# Patient Record
Sex: Female | Born: 1970 | Race: White | Hispanic: No | Marital: Married | Smoking: Never smoker
Health system: Southern US, Community
[De-identification: ages and names within clinical notes are randomized; demographics above are authoritative.]

## PROBLEM LIST (undated history)

## (undated) DIAGNOSIS — D249 Benign neoplasm of unspecified breast: Secondary | ICD-10-CM

## (undated) HISTORY — PX: CLOSED REDUCTION NASAL FRACTURE: SUR256

## (undated) HISTORY — DX: Benign neoplasm of unspecified breast: D24.9

---

## 2019-03-30 LAB — HM MAMMOGRAPHY

## 2019-04-19 LAB — HM PAP SMEAR

## 2020-01-11 ENCOUNTER — Ambulatory Visit: Payer: Self-pay

## 2020-01-14 ENCOUNTER — Ambulatory Visit: Payer: Self-pay | Attending: Internal Medicine

## 2020-01-14 DIAGNOSIS — Z23 Encounter for immunization: Secondary | ICD-10-CM

## 2020-01-14 NOTE — Progress Notes (Signed)
   Covid-19 Vaccination Clinic  Name:  Chelsea Sellers    MRN: [id number] DOB: 11-24-1970  01/14/2020  [name] was observed post Covid-19 immunization for 15 minutes without incident. She was provided with Vaccine Information Sheet and instruction to access the V-Safe system.   [name] was instructed to call 911 with any severe reactions post vaccine: Marland Kitchen Difficulty breathing  . Swelling of face and throat  . A fast heartbeat  . A bad rash all over body  . Dizziness and weakness   Immunizations Administered    Name Date Dose VIS Date Route   Pfizer COVID-19 Vaccine 01/14/2020 10:07 AM 0.3 mL 09/28/2019 Intramuscular   Manufacturer: [place]   Lot: [id number]   [place]: [id number]

## 2020-02-05 ENCOUNTER — Ambulatory Visit: Payer: Self-pay | Attending: Internal Medicine

## 2020-02-05 DIAGNOSIS — Z23 Encounter for immunization: Secondary | ICD-10-CM

## 2020-02-05 NOTE — Progress Notes (Signed)
   Covid-19 Vaccination Clinic  Name:  Chelsea Sellers    MRN: [id number] DOB: 08/05/1971  02/05/2020  [name] was observed post Covid-19 immunization for 15 minutes without incident. She was provided with Vaccine Information Sheet and instruction to access the V-Safe system.   [name] was instructed to call 911 with any severe reactions post vaccine: Marland Kitchen Difficulty breathing  . Swelling of face and throat  . A fast heartbeat  . A bad rash all over body  . Dizziness and weakness   Immunizations Administered    Name Date Dose VIS Date Route   Pfizer COVID-19 Vaccine 02/05/2020 12:26 PM 0.3 mL 12/12/2018 Intramuscular   Manufacturer: [place]   Lot: [id number]   [place]: [id number]

## 2020-02-16 ENCOUNTER — Encounter (HOSPITAL_COMMUNITY): Payer: Self-pay

## 2020-02-16 ENCOUNTER — Ambulatory Visit (INDEPENDENT_AMBULATORY_CARE_PROVIDER_SITE_OTHER): Payer: 59

## 2020-02-16 ENCOUNTER — Ambulatory Visit (HOSPITAL_COMMUNITY)
Admission: EM | Admit: 2020-02-16 | Discharge: 2020-02-16 | Disposition: A | Discharge status: Home / Self Care | Payer: 59 | Attending: Emergency Medicine | Admitting: Emergency Medicine

## 2020-02-16 ENCOUNTER — Other Ambulatory Visit: Payer: Self-pay

## 2020-02-16 DIAGNOSIS — S022XXA Fracture of nasal bones, initial encounter for closed fracture: Secondary | ICD-10-CM

## 2020-02-16 MED ORDER — IBUPROFEN 800 MG PO TABS: 800.0000 mg | ORAL_TABLET | Freq: Three times a day (TID) | ORAL | 0 refills | Status: DC

## 2020-02-16 MED ORDER — MUPIROCIN 2 % EX OINT: 1.0000 "application " | TOPICAL_OINTMENT | Freq: Two times a day (BID) | CUTANEOUS | 0 refills | Status: DC

## 2020-02-16 NOTE — ED Triage Notes (Signed)
Patient reports fall on Thursday. Shoulder and facial pain.

## 2020-02-16 NOTE — Discharge Instructions (Signed)
May follow up with ENT for further evaluation of nasal fracture Use anti-inflammatories for pain/swelling. You may take up to 800 mg Ibuprofen every 8 hours with food. You may supplement Ibuprofen with Tylenol [phone] mg every 8 hours.  Ice Gentle range of motion of shoulder and knee Make sure all wounds healing- keep clean and dry- follow up for concerns for infection

## 2020-02-17 NOTE — ED Provider Notes (Signed)
[facility]    CSN: [id number] Arrival date & time: 02/16/20  1145      History   Chief Complaint Chief Complaint  Patient presents with  . Fall    HPI Chelsea Sellers is a 49 y.o. female no significant past medical history presenting today for evaluation of nose injury.  Patient notes that she was walking her dog on a trail Thursday, approximately 2 days ago.  She lost her balance while going downhill and ended up running into a tree.  She is unsure about specific details of how she hit the tree, but does not believe she fell to the ground.  Denies loss of consciousness.  She immediately had pain around her nose is along with bleeding from her nose and mouth.  She also reports some left shoulder soreness and left knee soreness.  She denies difficulty moving either of these joints, but does feel achy and slightly tight.  She denies use of blood thinners.  Denies headaches or vision changes.  Her main concern is her nose.  Reports that she has had a more prominent bump than normal.  She denies difficulty breathing.  She expresses concern over appearance.   HPI  History reviewed. No pertinent past medical history.  There are no problems to display for this patient.   History reviewed. No pertinent surgical history.  OB History   No obstetric history on file.      Home Medications    Prior to Admission medications   Medication Sig Start Date End Date Taking? Authorizing Provider  ibuprofen (ADVIL) 800 MG tablet Take 1 tablet (800 mg total) by mouth 3 (three) times daily. 02/16/20   Wilberth Damon C, PA-C  mupirocin ointment (BACTROBAN) 2 % Apply 1 application topically 2 (two) times daily. 02/16/20   Giovanni Bath, Elesa Hacker, PA-C    Family History No family history on file.  Social History Social History   Tobacco Use  . Smoking status: Never Smoker  . Smokeless tobacco: Never Used  Substance Use Topics  . Alcohol use: Yes    Alcohol/week: 1.0 - 2.0 standard drinks      Types: 1 - 2 Standard drinks or equivalent per week  . Drug use: Not on file     Allergies   Patient has no known allergies.   Review of Systems Review of Systems  Constitutional: Negative for activity change, chills, diaphoresis and fatigue.  HENT: Positive for facial swelling. Negative for ear pain, tinnitus and trouble swallowing.   Eyes: Negative for photophobia and visual disturbance.  Respiratory: Negative for cough, chest tightness and shortness of breath.   Cardiovascular: Negative for chest pain and leg swelling.  Gastrointestinal: Negative for abdominal pain, blood in stool, nausea and vomiting.  Musculoskeletal: Positive for arthralgias. Negative for back pain, gait problem, myalgias, neck pain and neck stiffness.  Skin: Positive for color change and wound.  Neurological: Negative for dizziness, weakness, light-headedness, numbness and headaches.     Physical Exam Triage Vital Signs ED Triage Vitals  Enc Vitals Group     BP 02/16/20 1222 (!) 146/83     Pulse Rate 02/16/20 1222 70     Resp 02/16/20 1222 16     Temp 02/16/20 1222 98.3 F (36.8 C)     Temp Source 02/16/20 1222 Oral     SpO2 02/16/20 1222 100 %     Weight --      Height --      Head Circumference --  Peak Flow --      Pain Score 02/16/20 1220 1     Pain Loc --      Pain Edu? --      Excl. in [place]? --    No data found.  Updated Vital Signs BP (!) 146/83 (BP Location: Right Arm)   Pulse 70   Temp 98.3 F (36.8 C) (Oral)   Resp 16   LMP 02/10/2020 (Exact Date)   SpO2 100%   Visual Acuity Right Eye Distance:   Left Eye Distance:   Bilateral Distance:    Right Eye Near:   Left Eye Near:    Bilateral Near:     Physical Exam Vitals and nursing note reviewed.  Constitutional:      Appearance: She is well-developed.     Comments: No acute distress  HENT:     Head: Normocephalic and atraumatic.     Ears:     Comments: No hemotympanum bilaterally    Nose: Nose normal.      Comments: Nose with visible bump, but appears midline, bruising noted over bridge of nose, bilateral nares patent, dried blood noted within right nares anteriorly, superior portions of nares appear patent and without any bleeding bilaterally    Mouth/Throat:     Comments: Abrasions with scabbing noted to upper and lower lips, bruising noted extending into inner upper and lower lip, no soft palate swelling, teeth intact Eyes:     Extraocular Movements: Extraocular movements intact.     Conjunctiva/sclera: Conjunctivae normal.     Pupils: Pupils are equal, round, and reactive to light.     Comments: No hyphema, anterior chamber clear bilaterally, no periorbital tenderness, right infraorbital area with mild ecchymosis, nontender to palpation  Cardiovascular:     Rate and Rhythm: Normal rate.  Pulmonary:     Effort: Pulmonary effort is normal. No respiratory distress.  Abdominal:     General: There is no distension.  Musculoskeletal:        General: Normal range of motion.     Cervical back: Neck supple.     Comments: Left shoulder: Full active range of motion of shoulder, nontender to palpation over clavicle, AC joint or scapular spine  Left knee: Nontender to palpation over patella or medial lateral joint line, tenderness to infrapatellar area, full active range of motion  Gait with minimal abnormality  Skin:    General: Skin is warm and dry.  Neurological:     Mental Status: She is alert and oriented to person, place, and time.      UC Treatments / Results  Labs (all labs ordered are listed, but only abnormal results are displayed) Labs Reviewed - No data to display  EKG   Radiology DG Nasal Bones  Result Date: 02/16/2020 CLINICAL DATA:  Nasal pain insert lying with bruising 2 days after fall EXAM: NASAL BONES - 3+ VIEW COMPARISON:  None FINDINGS: Osseous mineralization normal. Nasal septum midline. Displaced distal nasal bone fracture fragments. Anterior maxillary spine  intact. Paranasal sinuses clear. IMPRESSION: Displaced distal nasal bone fracture fragments. Electronically Signed   By: Lavonia Dana M.D.   On: 02/16/2020 14:29    Procedures Procedures (including critical care time)  Medications Ordered in UC Medications - No data to display  Initial Impression / Assessment and Plan / UC Course  I have reviewed the triage vital signs and the nursing notes.  Pertinent labs & imaging results that were available during my care of the patient were reviewed by  me and considered in my medical decision making (see chart for details).     Nose was displaced fracture fragment distally, will have follow-up with ENT as patient expresses concern over cosmetic appearances of fracture to see if this is anything worth fixing.  Recommended ice and anti-inflammatories.  Do not suspect fractures of shoulder or knee at this time.  Continue to monitor.  Discussed strict return precautions. Patient verbalized understanding and is agreeable with plan.  Final Clinical Impressions(s) / UC Diagnoses   Final diagnoses:  Closed fracture of nasal bone, initial encounter     Discharge Instructions     May follow up with ENT for further evaluation of nasal fracture Use anti-inflammatories for pain/swelling. You may take up to 800 mg Ibuprofen every 8 hours with food. You may supplement Ibuprofen with Tylenol [phone] mg every 8 hours.  Ice Gentle range of motion of shoulder and knee Make sure all wounds healing- keep clean and dry- follow up for concerns for infection   ED Prescriptions    Medication Sig Dispense Auth. Provider   ibuprofen (ADVIL) 800 MG tablet Take 1 tablet (800 mg total) by mouth 3 (three) times daily. 21 tablet Bhavya Grand C, PA-C   mupirocin ointment (BACTROBAN) 2 % Apply 1 application topically 2 (two) times daily. 30 g Truda Staub, [place] C, PA-C     PDMP not reviewed this encounter.   Janith Lima, [place] 02/17/20 [phone]

## 2020-02-22 ENCOUNTER — Ambulatory Visit (INDEPENDENT_AMBULATORY_CARE_PROVIDER_SITE_OTHER): Payer: 59 | Admitting: Otolaryngology

## 2020-02-22 ENCOUNTER — Other Ambulatory Visit: Payer: Self-pay

## 2020-02-22 ENCOUNTER — Encounter (INDEPENDENT_AMBULATORY_CARE_PROVIDER_SITE_OTHER): Payer: Self-pay | Admitting: Otolaryngology

## 2020-02-22 VITALS — Temp 98.1°F

## 2020-02-22 DIAGNOSIS — S022XXA Fracture of nasal bones, initial encounter for closed fracture: Secondary | ICD-10-CM

## 2020-02-22 NOTE — Progress Notes (Signed)
HPI: Chelsea Sellers is a 49 y.o. female who presents for evaluation of nasal fracture.  Patient was walking her dog on 02/14/2020 and fell landing on her nose.  She has slight abrasion to the nose.  She recently underwent plain nasal x-rays that demonstrated distal nasal bone fracture with the fracture piece pointing perpendicular.  She is having no trouble breathing but slight pressure on the lower lip and upper teeth.  No bleeding from her nose. She does not smoke. She is on no medications and NKDA  No past medical history on file. No past surgical history on file. Social History   Socioeconomic History  . Marital status: Married    Spouse name: Not on file  . Number of children: Not on file  . Years of education: Not on file  . Highest education level: Not on file  Occupational History  . Not on file  Tobacco Use  . Smoking status: Never Smoker  . Smokeless tobacco: Never Used  Substance and Sexual Activity  . Alcohol use: Yes    Alcohol/week: 1.0 - 2.0 standard drinks    Types: 1 - 2 Standard drinks or equivalent per week  . Drug use: Not on file  . Sexual activity: Not on file  Other Topics Concern  . Not on file  Social History Narrative  . Not on file   Social Determinants of Health   Financial Resource Strain:   . Difficulty of Paying Living Expenses:   Food Insecurity:   . Worried About Charity fundraiser in the Last Year:   . Arboriculturist in the Last Year:   Transportation Needs:   . Film/video editor (Medical):   Marland Kitchen Lack of Transportation (Non-Medical):   Physical Activity:   . Days of Exercise per Week:   . Minutes of Exercise per Session:   Stress:   . Feeling of Stress :   Social Connections:   . Frequency of Communication with Friends and Family:   . Frequency of Social Gatherings with Friends and Family:   . Attends Religious Services:   . Active Member of Clubs or Organizations:   . Attends Archivist Meetings:   Marland Kitchen Marital Status:     No family history on file. No Known Allergies Prior to Admission medications   Medication Sig Start Date End Date Taking? Authorizing Provider  ibuprofen (ADVIL) 800 MG tablet Take 1 tablet (800 mg total) by mouth 3 (three) times daily. 02/16/20  Yes Wieters, Hallie C, PA-C  mupirocin ointment (BACTROBAN) 2 % Apply 1 application topically 2 (two) times daily. 02/16/20  Yes Wieters, Hallie C, PA-C     Positive ROS: Otherwise negative  All other systems have been reviewed and were otherwise negative with the exception of those mentioned in the HPI and as above.  Physical Exam: Constitutional: Alert, well-appearing, no acute distress Ears: External ears without lesions or tenderness. Ear canals are clear bilaterally with intact, clear TMs.  Nasal: She has a discrete hump on the nasal dorsum at the distal end of the nasal bones.  Nasal bones are relatively straight otherwise.  Intranasal exam reveals minimal septal deformity with no evidence of septal hematoma and clear nasal passages bilaterally. Oral: Lips and gums without lesions. Tongue and palate mucosa without lesions. Posterior oropharynx clear. Neck: No palpable adenopathy or masses Respiratory: Breathing comfortably.  Lungs clear to auscultation Cardiac exam: Regular rate and rhythm without murmur. Skin: No facial/neck lesions or rash noted.  Procedure:  After discussion with the patient we elected to try manual manipulation in the office.  I injected a quarter of a cc of local anesthetic 1% Xylocaine with epinephrine on either side of the nasal bones.  Then using manual manipulation I attempted to reduce the distal fragmented segment of the nasal bone but this had a tendency to reduce and then pop back up.  No bleeding was caused.  Procedures  Assessment: Nasal fracture of distal nasal bone tip.  Plan: Discussed with patient that this will require open reduction in order to remove the fragmented tip. This can be accomplished  through intranasal excision but will require general anesthetic and reviewed this with her. She will call us back when she decides to schedule this.  Radene Journey, MD

## 2020-09-20 ENCOUNTER — Ambulatory Visit: Payer: Managed Care, Other (non HMO) | Attending: Internal Medicine

## 2020-09-20 DIAGNOSIS — Z23 Encounter for immunization: Secondary | ICD-10-CM

## 2020-09-20 NOTE — Progress Notes (Signed)
   Covid-19 Vaccination Clinic  Name:  [name]    MRN: [id number] DOB: [birth year]  09/20/2020  Chelsea Sellers was observed post Covid-19 immunization for 15 minutes without incident. She was provided with Vaccine Information Sheet and instruction to access the V-Safe system.   [name] was instructed to call 911 with any severe reactions post vaccine: Marland Kitchen Difficulty breathing  . Swelling of face and throat  . A fast heartbeat  . A bad rash all over body  . Dizziness and weakness   Immunizations Administered    Name Date Dose VIS Date Route   Pfizer COVID-19 Vaccine 09/20/2020  9:58 AM 0.3 mL 08/06/2020 Intramuscular   Manufacturer: [place]   Lot: [id number]   NDC: [address]-9

## 2021-01-06 ENCOUNTER — Telehealth: Payer: Self-pay

## 2021-01-06 ENCOUNTER — Other Ambulatory Visit: Payer: Self-pay | Admitting: Family Medicine

## 2021-01-06 ENCOUNTER — Other Ambulatory Visit: Payer: Self-pay

## 2021-01-06 ENCOUNTER — Encounter: Payer: Self-pay | Admitting: Family Medicine

## 2021-01-06 ENCOUNTER — Ambulatory Visit: Payer: Managed Care, Other (non HMO) | Admitting: Family Medicine

## 2021-01-06 VITALS — BP 120/78 | HR 67 | Ht 63.0 in | Wt 111.2 lb

## 2021-01-06 DIAGNOSIS — Z9889 Other specified postprocedural states: Secondary | ICD-10-CM

## 2021-01-06 DIAGNOSIS — Z833 Family history of diabetes mellitus: Secondary | ICD-10-CM | POA: Diagnosis not present

## 2021-01-06 DIAGNOSIS — D242 Benign neoplasm of left breast: Secondary | ICD-10-CM

## 2021-01-06 DIAGNOSIS — N644 Mastodynia: Secondary | ICD-10-CM

## 2021-01-06 DIAGNOSIS — Z872 Personal history of diseases of the skin and subcutaneous tissue: Secondary | ICD-10-CM

## 2021-01-06 NOTE — Progress Notes (Signed)
   Subjective:    Patient ID: Chelsea Sellers, female    DOB: 1971/03/29, 50 y.o.   MRN: [id number]  HPI Chief Complaint  Patient presents with  . new pt    New pt get established, wants hormones checked., breast lump- right breast-, some numbness at nose due to injury last year   She is new to the practice and here to establish care. Previous medical care: moved here from [place] in 2020   Other providers: None   Having regular cycles every 27-29 days. Bleeding is light. Has never missed a cycle.  Occasional hot flashes   History of 2 breast biopsies in left breast. Benign  No family history of breast cancer  Currently she is needing follow up on right breast. [place] in [place] in 2020 showed fibroadenoma and cyst.    Disc issues on L-5, S-1 and occasional numbness down left foot.  States she saw neurosurgery and ortho in [place]. PT exercises   States her mother has diabetes.    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    Reviewed allergies, medications, past medical, surgical, family, and social history.    Review of Systems Pertinent positives and negatives in the history of present illness.     Objective:   Physical Exam BP 120/78   Pulse 67   Ht 5\' 3"  (1.6 m)   Wt 111 lb 3.2 oz (50.4 kg)   BMI 19.70 kg/m   Alert and in no distress.  Cardiac exam shows a regular sinus rhythm without murmurs or gallops. Lungs are clear to auscultation.  Breast exam unremarkable, no discrete palpable mass.       Assessment & Plan:  Fibroadenoma of left breast - Plan: CBC with Differential/Platelet, Comprehensive metabolic panel, MM Digital Diagnostic Bilat  History of cyst of breast - Plan: MM Digital Diagnostic Bilat  Family history of diabetes mellitus in mother - Plan: CBC with Differential/Platelet, Comprehensive metabolic panel  Breast tenderness - Plan: CBC with Differential/Platelet, Comprehensive metabolic panel, MM Digital  Diagnostic Bilat  H/O benign breast biopsy - Plan: MM Digital Diagnostic Bilat  I will order a diagnostic mammogram.  Check labs today family history of diabetes and recent breast tenderness. Counseling on menopausal symptoms and how to tell if she is becoming perimenopausal.

## 2021-01-06 NOTE — Telephone Encounter (Signed)
Called pt. LM letting her know that [facility] at 8:40 a.m. arrive at 8:20 a.m.

## 2021-01-07 LAB — CBC WITH DIFFERENTIAL/PLATELET
Basophils Absolute: 0 10*3/uL (ref 0.0–0.2)
Basos: 1 %
EOS (ABSOLUTE): 0.1 10*3/uL (ref 0.0–0.4)
Eos: 1 %
Hematocrit: 38.5 % (ref 34.0–46.6)
Hemoglobin: 12.6 g/dL (ref 11.1–15.9)
Immature Grans (Abs): 0 10*3/uL (ref 0.0–0.1)
Immature Granulocytes: 0 %
Lymphocytes Absolute: 1.8 10*3/uL (ref 0.7–3.1)
Lymphs: 30 %
MCH: 28.7 pg (ref 26.6–33.0)
MCHC: 32.7 g/dL (ref 31.5–35.7)
MCV: 88 fL (ref 79–97)
Monocytes Absolute: 0.4 10*3/uL (ref 0.1–0.9)
Monocytes: 7 %
Neutrophils Absolute: 3.7 10*3/uL (ref 1.4–7.0)
Neutrophils: 61 %
Platelets: 320 10*3/uL (ref 150–450)
RBC: 4.39 x10E6/uL (ref 3.77–5.28)
RDW: 13.3 % (ref 11.7–15.4)
WBC: 6.1 10*3/uL (ref 3.4–10.8)

## 2021-01-07 LAB — COMPREHENSIVE METABOLIC PANEL
ALT: 9 IU/L (ref 0–32)
AST: 19 IU/L (ref 0–40)
Albumin/Globulin Ratio: 2 (ref 1.2–2.2)
Albumin: 4.9 g/dL — ABNORMAL HIGH (ref 3.8–4.8)
Alkaline Phosphatase: 43 IU/L — ABNORMAL LOW (ref 44–121)
BUN/Creatinine Ratio: 12 (ref 9–23)
BUN: 9 mg/dL (ref 6–24)
Bilirubin Total: 0.4 mg/dL (ref 0.0–1.2)
CO2: 21 mmol/L (ref 20–29)
Calcium: 9.5 mg/dL (ref 8.7–10.2)
Chloride: 102 mmol/L (ref 96–106)
Creatinine, Ser: 0.74 mg/dL (ref 0.57–1.00)
Globulin, Total: 2.4 g/dL (ref 1.5–4.5)
Glucose: 95 mg/dL (ref 65–99)
Potassium: 4 mmol/L (ref 3.5–5.2)
Sodium: 141 mmol/L (ref 134–144)
Total Protein: 7.3 g/dL (ref 6.0–8.5)
eGFR: 99 mL/min/{1.73_m2} (ref >59)

## 2021-01-07 NOTE — Progress Notes (Signed)
Her labs are fine

## 2021-01-13 ENCOUNTER — Telehealth: Payer: Self-pay | Admitting: Family Medicine

## 2021-01-13 NOTE — Telephone Encounter (Signed)
Requested records received from [facility]

## 2021-01-19 ENCOUNTER — Encounter: Payer: Self-pay | Admitting: Internal Medicine

## 2021-01-27 ENCOUNTER — Telehealth: Payer: Self-pay | Admitting: Family Medicine

## 2021-01-27 ENCOUNTER — Encounter: Payer: Self-pay | Admitting: Internal Medicine

## 2021-01-27 NOTE — Telephone Encounter (Signed)
Received requested info from [facility]

## 2021-02-13 ENCOUNTER — Telehealth: Payer: Self-pay | Admitting: Internal Medicine

## 2021-02-13 NOTE — Telephone Encounter (Signed)
Per the record from 2019, she will need regular follow up with annual mammograms.

## 2021-02-13 NOTE — Telephone Encounter (Signed)
Pt called and having issues with scheduling due to insurance but she is questioning if she is suppose to have a right [place] vs the left. She has already had biopsies of left breast and she was complaining of right breast issues when she was here back in march. Please advise.

## 2021-02-13 NOTE — Telephone Encounter (Signed)
Pt was notified.  

## 2021-02-17 ENCOUNTER — Other Ambulatory Visit: Payer: Managed Care, Other (non HMO)

## 2021-04-14 ENCOUNTER — Other Ambulatory Visit: Payer: Self-pay | Admitting: Family Medicine

## 2021-04-14 DIAGNOSIS — Z872 Personal history of diseases of the skin and subcutaneous tissue: Secondary | ICD-10-CM

## 2021-04-14 DIAGNOSIS — D242 Benign neoplasm of left breast: Secondary | ICD-10-CM

## 2021-04-14 DIAGNOSIS — Z9889 Other specified postprocedural states: Secondary | ICD-10-CM

## 2021-04-17 ENCOUNTER — Other Ambulatory Visit: Payer: Self-pay | Admitting: Family Medicine

## 2021-04-17 DIAGNOSIS — Z1231 Encounter for screening mammogram for malignant neoplasm of breast: Secondary | ICD-10-CM

## 2021-04-23 ENCOUNTER — Ambulatory Visit
Admission: RE | Admit: 2021-04-23 | Discharge: 2021-04-23 | Disposition: A | Discharge status: Home / Self Care | Payer: Managed Care, Other (non HMO) | Source: Ambulatory Visit | Attending: Family Medicine | Admitting: Family Medicine

## 2021-04-23 ENCOUNTER — Other Ambulatory Visit: Payer: Self-pay

## 2021-04-23 DIAGNOSIS — Z1231 Encounter for screening mammogram for malignant neoplasm of breast: Secondary | ICD-10-CM

## 2021-04-29 ENCOUNTER — Other Ambulatory Visit: Payer: Self-pay | Admitting: Family Medicine

## 2021-04-29 DIAGNOSIS — R928 Other abnormal and inconclusive findings on diagnostic imaging of breast: Secondary | ICD-10-CM

## 2021-05-18 ENCOUNTER — Other Ambulatory Visit: Payer: Self-pay | Admitting: Family Medicine

## 2021-05-18 ENCOUNTER — Ambulatory Visit
Admission: RE | Admit: 2021-05-18 | Discharge: 2021-05-18 | Disposition: A | Discharge status: Home / Self Care | Payer: Managed Care, Other (non HMO) | Source: Ambulatory Visit | Attending: Family Medicine | Admitting: Family Medicine

## 2021-05-18 ENCOUNTER — Other Ambulatory Visit: Payer: Self-pay

## 2021-05-18 DIAGNOSIS — D242 Benign neoplasm of left breast: Secondary | ICD-10-CM

## 2021-05-18 DIAGNOSIS — R928 Other abnormal and inconclusive findings on diagnostic imaging of breast: Secondary | ICD-10-CM

## 2021-05-18 DIAGNOSIS — Z872 Personal history of diseases of the skin and subcutaneous tissue: Secondary | ICD-10-CM

## 2021-05-18 DIAGNOSIS — N6002 Solitary cyst of left breast: Secondary | ICD-10-CM

## 2021-05-18 DIAGNOSIS — Z9889 Other specified postprocedural states: Secondary | ICD-10-CM

## 2021-11-19 ENCOUNTER — Other Ambulatory Visit: Payer: Managed Care, Other (non HMO)

## 2021-12-10 ENCOUNTER — Other Ambulatory Visit: Payer: Self-pay | Admitting: Family Medicine

## 2021-12-10 ENCOUNTER — Ambulatory Visit
Admission: RE | Admit: 2021-12-10 | Discharge: 2021-12-10 | Disposition: A | Discharge status: Home / Self Care | Payer: Managed Care, Other (non HMO) | Source: Ambulatory Visit | Attending: Family Medicine | Admitting: Family Medicine

## 2021-12-10 DIAGNOSIS — N6002 Solitary cyst of left breast: Secondary | ICD-10-CM

## 2022-01-04 LAB — HM PAP SMEAR: HPV, high-risk: NEGATIVE

## 2022-01-26 ENCOUNTER — Ambulatory Visit: Payer: Managed Care, Other (non HMO) | Admitting: Physician Assistant

## 2022-03-26 ENCOUNTER — Encounter: Payer: Self-pay | Admitting: Family Medicine

## 2022-03-26 ENCOUNTER — Ambulatory Visit: Payer: Managed Care, Other (non HMO) | Admitting: Family Medicine

## 2022-03-26 VITALS — BP 122/70 | HR 64 | Temp 97.9°F | Wt 111.0 lb

## 2022-03-26 DIAGNOSIS — M9260 Juvenile osteochondrosis of tarsus, unspecified ankle: Secondary | ICD-10-CM | POA: Diagnosis not present

## 2022-03-26 NOTE — Patient Instructions (Signed)
Use heel cups regularly take 2 Aleve twice per day for the next several weeks.  If it hurts do not do it

## 2022-03-26 NOTE — Progress Notes (Signed)
   Subjective:    Patient ID: Chelsea Sellers, female    DOB: 01-31-1971, 51 y.o.   MRN: [id number]  HPI She complains of a 1 month history of right heel pain.  No history of injury or overuse.  The pain is worse with standing.  She has been reading on this and has been doing some stretching exercises thinking this might be plantar fasciitis.  Apparently the pain is not worse when she first gets up in the morning and as mentioned earlier standing makes it worse.   Review of Systems     Objective:   Physical Exam Exam of the right foot shows full range of motion of the foot.  No tenderness over the Achilles tendon or in the rectal calcaneal area.  No tenderness over the calcaneal spur.  Slight tenderness along the posterior medial aspect of the calcaneus.       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  Recommend 2 Aleve twice per day for the next 2+ weeks and also heel cups.  Explained that there might be more that we can do if this continues.

## 2022-04-16 ENCOUNTER — Encounter: Payer: Self-pay | Admitting: Internal Medicine

## 2022-06-11 ENCOUNTER — Ambulatory Visit
Admission: RE | Admit: 2022-06-11 | Discharge: 2022-06-11 | Disposition: A | Discharge status: Home / Self Care | Payer: Managed Care, Other (non HMO) | Source: Ambulatory Visit | Attending: Family Medicine | Admitting: Family Medicine

## 2022-06-11 DIAGNOSIS — N6002 Solitary cyst of left breast: Secondary | ICD-10-CM

## 2022-06-23 ENCOUNTER — Encounter: Payer: Self-pay | Admitting: Internal Medicine

## 2022-07-27 ENCOUNTER — Encounter: Payer: Self-pay | Admitting: Internal Medicine

## 2022-08-31 ENCOUNTER — Encounter: Payer: Self-pay | Admitting: Internal Medicine

## 2022-10-01 ENCOUNTER — Ambulatory Visit (INDEPENDENT_AMBULATORY_CARE_PROVIDER_SITE_OTHER): Payer: Managed Care, Other (non HMO)

## 2022-10-01 ENCOUNTER — Encounter: Payer: Self-pay | Admitting: Podiatry

## 2022-10-01 ENCOUNTER — Ambulatory Visit: Payer: Managed Care, Other (non HMO) | Admitting: Podiatry

## 2022-10-01 DIAGNOSIS — M722 Plantar fascial fibromatosis: Secondary | ICD-10-CM

## 2022-10-01 DIAGNOSIS — R52 Pain, unspecified: Secondary | ICD-10-CM

## 2022-10-01 MED ORDER — DICLOFENAC SODIUM 75 MG PO TBEC: 75.0000 mg | DELAYED_RELEASE_TABLET | Freq: Two times a day (BID) | ORAL | 2 refills | Status: DC

## 2022-10-01 MED ORDER — TRIAMCINOLONE ACETONIDE 10 MG/ML IJ SUSP
10.0000 mg | Freq: Once | INTRAMUSCULAR | Status: AC
  Administered 2022-10-01: 10 mg

## 2022-10-01 NOTE — Patient Instructions (Signed)

## 2022-10-04 NOTE — Progress Notes (Signed)
Subjective:   Patient ID: Chelsea Sellers, female   DOB: 51 y.o.   MRN: [id number]   HPI Patient presents with a lot of pain in the plantar of the right heel states its really been sore making it hard to walk on and states it has been going on for several months.  Left 1 mildly sore not to the same degree and patient does not remember specific injury pattern.  Patient does not smoke likes to be active   Review of Systems  All other systems reviewed and are negative.       Objective:  Physical Exam Vitals and nursing note reviewed.  Constitutional:      Appearance: She is well-developed.  Pulmonary:     Effort: Pulmonary effort is normal.  Musculoskeletal:        General: Normal range of motion.  Skin:    General: Skin is warm.  Neurological:     Mental Status: She is alert.     Neurovascular status intact muscle strength adequate range of motion adequate patient found to have discomfort fluid buildup around the plantar aspect heel region right over left heel with fluid at the insertional point tendon calcaneus.  Patient is found to have good digital perfusion well-oriented x 3     Assessment:  Acute plantar fasciitis right inflammation fluid of the medial band at insertion     Plan:  H&P x-ray reviewed condition discussed sterile prep injected the fascia right 3 mg Kenalog 5 mg Xylocaine applied fascial brace properly fitted into the arch to take pressure off the arch and to lift it up and prescribed diclofenac 75 mg twice daily.  Reappoint to recheck  X-rays indicate spur formation no indication stress fracture arthritis

## 2022-10-15 ENCOUNTER — Ambulatory Visit: Payer: Managed Care, Other (non HMO) | Admitting: Podiatry

## 2022-11-15 ENCOUNTER — Other Ambulatory Visit: Payer: Self-pay | Admitting: Podiatry

## 2022-11-15 DIAGNOSIS — R52 Pain, unspecified: Secondary | ICD-10-CM

## 2022-11-15 DIAGNOSIS — M722 Plantar fascial fibromatosis: Secondary | ICD-10-CM

## 2022-12-09 ENCOUNTER — Ambulatory Visit: Payer: Managed Care, Other (non HMO) | Admitting: Nurse Practitioner

## 2022-12-09 ENCOUNTER — Encounter: Payer: Self-pay | Admitting: Nurse Practitioner

## 2022-12-09 VITALS — BP 116/70 | HR 74 | Wt 112.0 lb

## 2022-12-09 DIAGNOSIS — R198 Other specified symptoms and signs involving the digestive system and abdomen: Secondary | ICD-10-CM

## 2022-12-09 DIAGNOSIS — R300 Dysuria: Secondary | ICD-10-CM

## 2022-12-09 LAB — POCT URINALYSIS DIP (CLINITEK)
Bilirubin, UA: NEGATIVE
Blood, UA: NEGATIVE
Glucose, UA: NEGATIVE mg/dL
Ketones, POC UA: NEGATIVE mg/dL
Leukocytes, UA: NEGATIVE
Nitrite, UA: NEGATIVE
POC PROTEIN,UA: NEGATIVE
Spec Grav, UA: 1.01 (ref 1.010–1.025)
Urobilinogen, UA: 0.2 E.U./dL
pH, UA: 7.5 (ref 5.0–8.0)

## 2022-12-09 MED ORDER — PANTOPRAZOLE SODIUM 40 MG PO TBEC: DELAYED_RELEASE_TABLET | ORAL | 0 refills | Status: DC

## 2022-12-09 NOTE — Patient Instructions (Addendum)
We will try pantoprazole for about a month and see how you are doing.  If you are not feeling better by Monday, please let me know.   I will call you if your urine shows infection.

## 2022-12-09 NOTE — Progress Notes (Signed)
Orma Render, DNP, AGNP-c [facility] [address] [place], [place] [address] [phone]  Subjective:   Chelsea Sellers is a 52 y.o. female presents to day for evaluation of: Tahiri presents today with concerns about abdominal discomfort. She describes experiencing pain and a pinching sensation in the abdominal area, which she finds very annoying. The discomfort began within the past week or two, and she recalls an episode of severe gas pain approximately a month ago. Deah has identified a correlation between the consumption of certain foods and the onset of her symptoms, including a heavy sensation post-eating and increased discomfort while sleeping. She does not report any heartburn-like symptoms and characterizes the pain as centrally located, occasionally radiating slightly to the left side.  She also reports a pulling sensation originating from the belly button area, accompanied by increased gas. Dalasia denies experiencing any back or shoulder blade pain but does mention a history of a degenerative disc, which contributes to tightness and tenderness. She notes that poor posture and slouching seem to worsen the pinching sensation. Elta denies the presence of nausea, vomiting, or shortness of breath. Additionally, she reports a history of urinary frequency and urinary tract infections.  PMH, Medications, and Allergies reviewed and updated in chart as appropriate.   ROS negative except for what is listed in HPI. Objective:  BP 116/70   Pulse 74   Wt 112 lb (50.8 kg)   BMI 19.84 kg/m  Physical Exam Vitals and nursing note reviewed.  Constitutional:      Appearance: Normal appearance. She is normal weight.  HENT:     Head: Normocephalic.  Eyes:     Pupils: Pupils are equal, round, and reactive to light.  Neck:     Vascular: No carotid bruit.  Cardiovascular:     Rate and Rhythm: Normal rate and regular rhythm.     Pulses: Normal pulses.     Heart sounds: Normal  heart sounds.  Pulmonary:     Effort: Pulmonary effort is normal.     Breath sounds: Normal breath sounds.  Abdominal:     General: Abdomen is flat. Bowel sounds are normal. There is no distension.     Palpations: Abdomen is soft.     Tenderness: There is no abdominal tenderness. There is no right CVA tenderness, left CVA tenderness, guarding or rebound.     Comments: Abdominal aorta palpable given body habitus. Tightening of the abdominal wall present to the left of the umbilicus.   Musculoskeletal:        General: Normal range of motion.  Lymphadenopathy:     Cervical: No cervical adenopathy.  Skin:    General: Skin is warm and dry.     Capillary Refill: Capillary refill takes less than 2 seconds.  Neurological:     General: No focal deficit present.     Mental Status: She is alert and oriented to person, place, and time.  Psychiatric:        Mood and Affect: Mood normal.           Assessment & Plan:   Problem List Items Addressed This Visit     Peptic ulcer symptoms - Primary    Patient presents with central abdominal pain, pinching sensation, increased gas, and discomfort under the rib cage. No heartburn, nausea, or vomiting reported. Patient reports localized pain and pulling sensation around the umbilical area. Physical examination revealed tightness in the area but no concerning findings related to the umbilical artery.Given the symptoms and  location, consider possible peptic ulcer presence. She does endorse a diet high in spicy foods.  Plan: - Start Pantoprazole (PPI) for 1 to 2 weeks at a high dose, then reduce to a normal dose for the remaining 2 to 3 weeks, for a total of 4 weeks of treatment. - Instruct the patient to avoid spicy foods and limit coffee, tea, and soda intake. - Monitor for improvement in symptoms and follow up in a month via phone call. - If no improvement within a couple of days, the patient should contact the clinic for further evaluation.       Relevant Medications   pantoprazole (PROTONIX) 40 MG tablet   Dysuria    UA negative in the office today. No changes.       Relevant Orders   POCT URINALYSIS DIP (CLINITEK) (Completed)      Orma Render, DNP, AGNP-c 12/10/2022  8:34 PM    History, Medications, Surgery, SDOH, and Family History reviewed and updated as appropriate.

## 2022-12-10 DIAGNOSIS — R300 Dysuria: Secondary | ICD-10-CM | POA: Insufficient documentation

## 2022-12-10 DIAGNOSIS — R198 Other specified symptoms and signs involving the digestive system and abdomen: Secondary | ICD-10-CM | POA: Insufficient documentation

## 2022-12-10 DIAGNOSIS — K219 Gastro-esophageal reflux disease without esophagitis: Secondary | ICD-10-CM | POA: Insufficient documentation

## 2022-12-10 NOTE — Assessment & Plan Note (Signed)
UA negative in the office today. No changes.

## 2022-12-10 NOTE — Assessment & Plan Note (Addendum)
Patient presents with central abdominal pain, pinching sensation, increased gas, and discomfort under the rib cage. No heartburn, nausea, or vomiting reported. Patient reports localized pain and pulling sensation around the umbilical area. Physical examination revealed tightness in the area but no concerning findings related to the umbilical artery.Given the symptoms and location, consider possible peptic ulcer presence. She does endorse a diet high in spicy foods.  Plan: - Start Pantoprazole (PPI) for 1 to 2 weeks at a high dose, then reduce to a normal dose for the remaining 2 to 3 weeks, for a total of 4 weeks of treatment. - Instruct the patient to avoid spicy foods and limit coffee, tea, and soda intake. - Monitor for improvement in symptoms and follow up in a month via phone call. - If no improvement within a couple of days, the patient should contact the clinic for further evaluation.

## 2023-04-28 IMAGING — MG MM DIGITAL SCREENING BILAT W/ TOMO AND CAD
8 series · 9 of 24 positions shown · non-contrast
Comparison: Previous exam(s).

CLINICAL DATA: Screening.

EXAM:
DIGITAL SCREENING BILATERAL MAMMOGRAM WITH TOMOSYNTHESIS AND CAD
TECHNIQUE: Bilateral screening digital craniocaudal and mediolateral oblique
mammograms were obtained. Bilateral screening digital breast
tomosynthesis was performed. The images were evaluated with
computer-aided detection.

[L CC synth-2D]
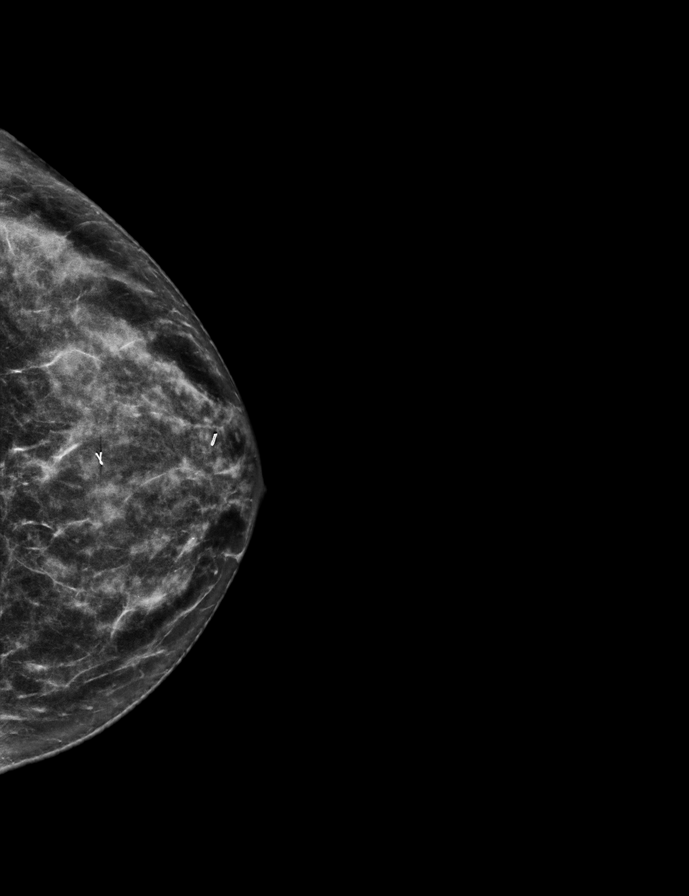

[R CC synth-2D]
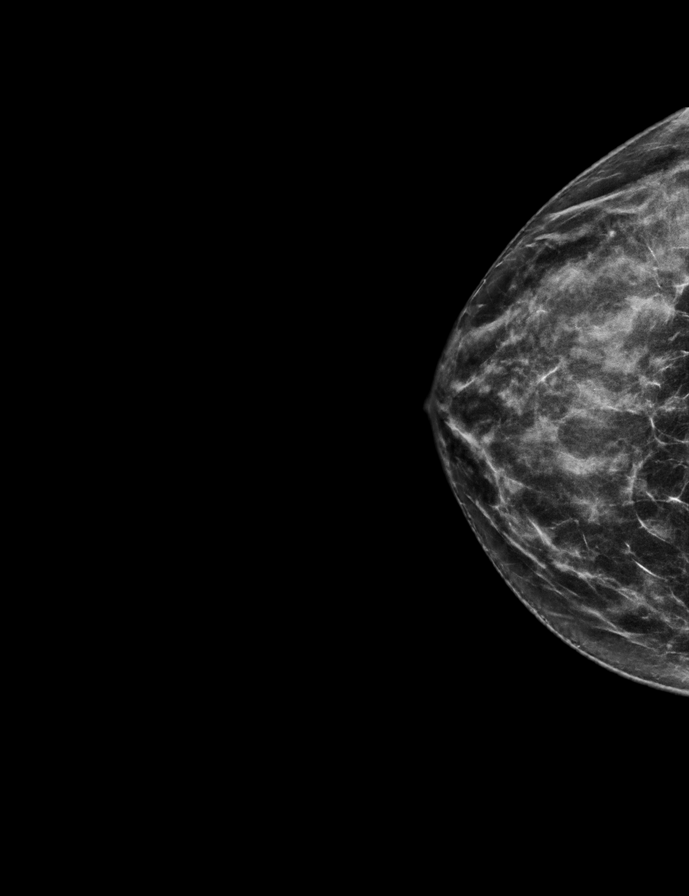

[R MLO synth-2D]
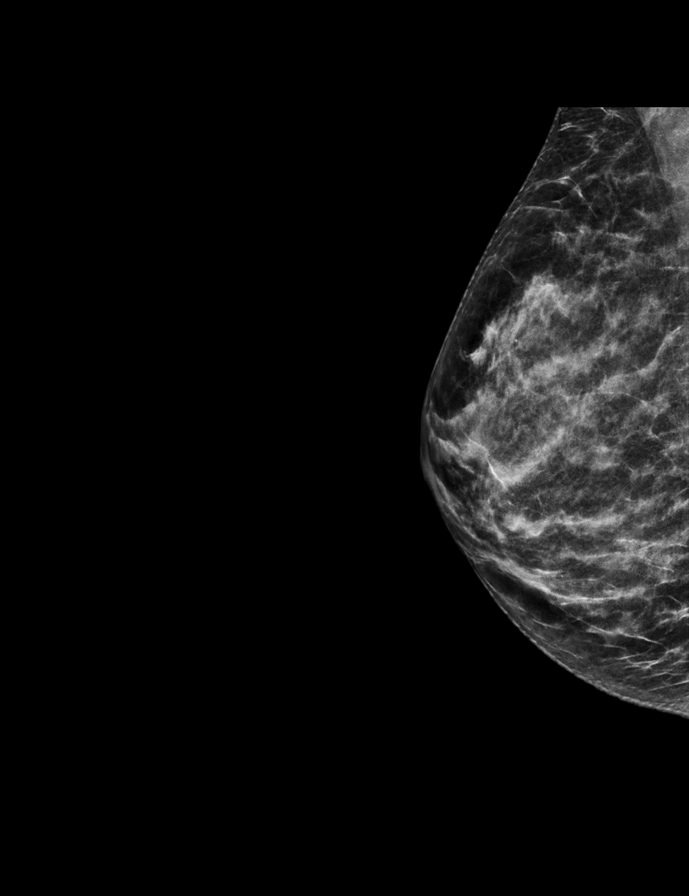

[L MLO synth-2D]
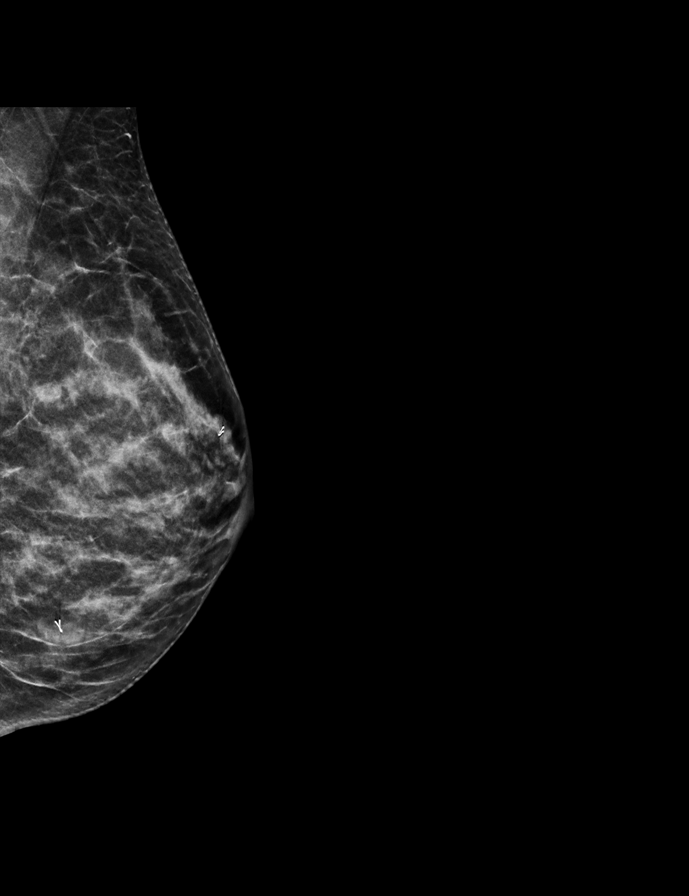

[L CC tomo · 2 of 54 frames shown]
[frame 18/54]
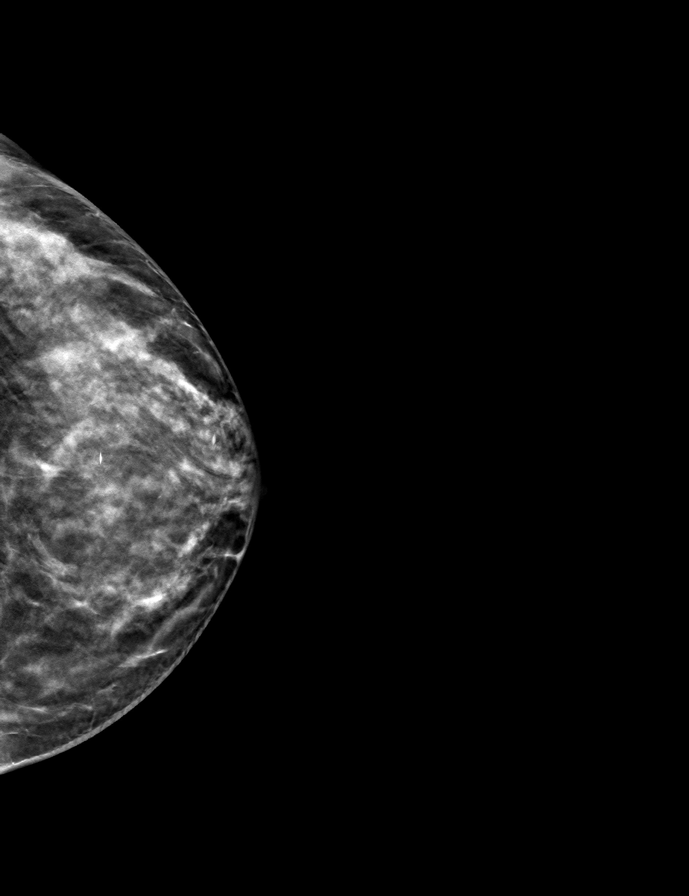
[frame 27/54]
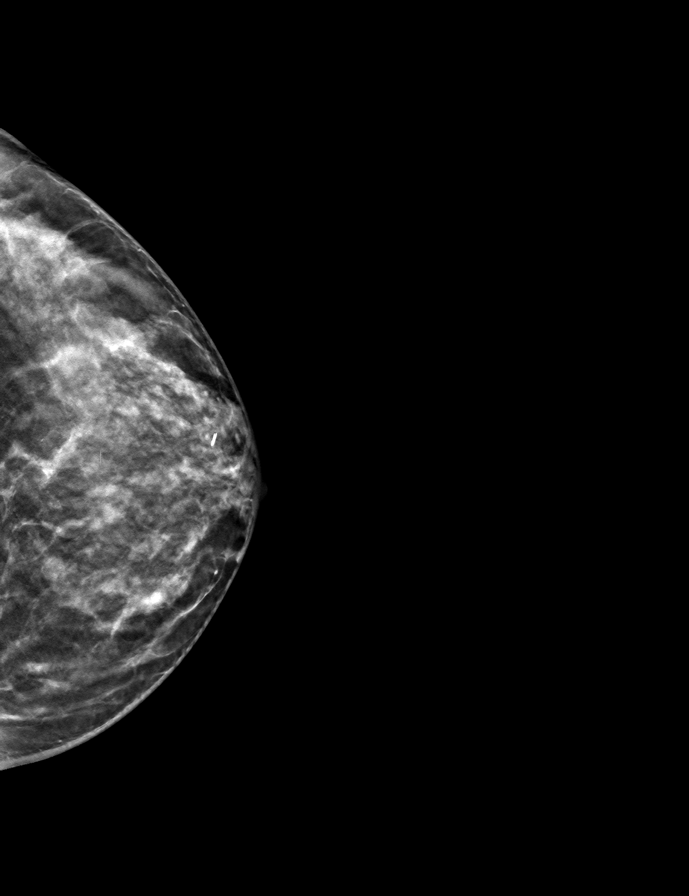

[R CC tomo · tomo slice 28/55.0]
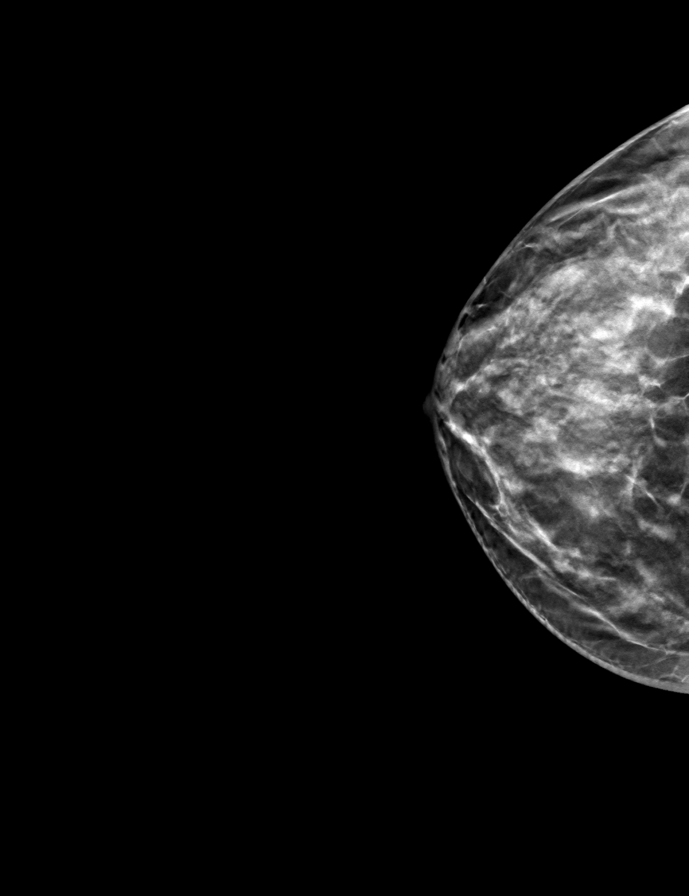

[L MLO tomo · tomo slice 25/50.0]
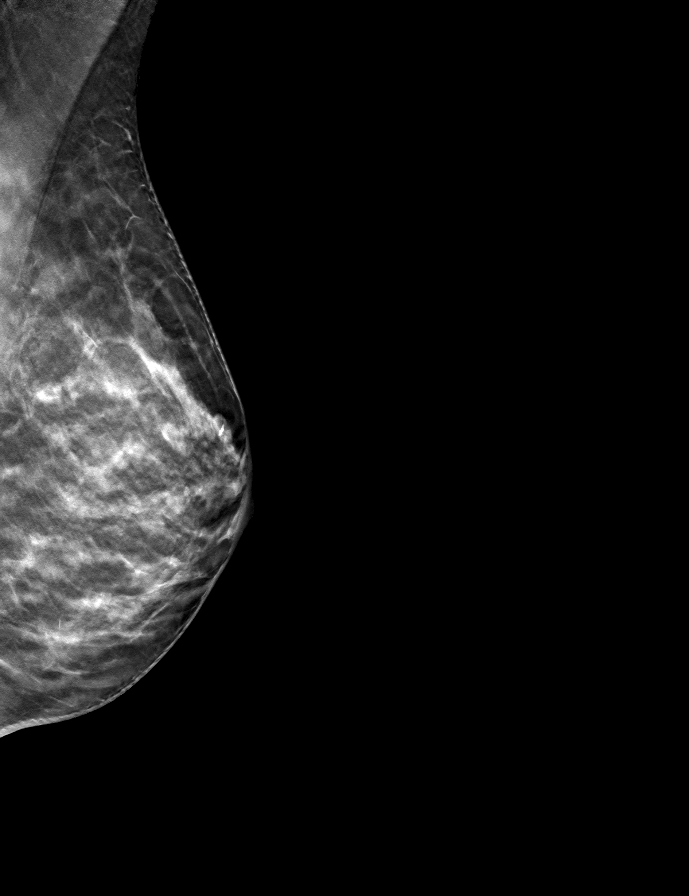

[R MLO tomo · tomo slice 25/49.0]
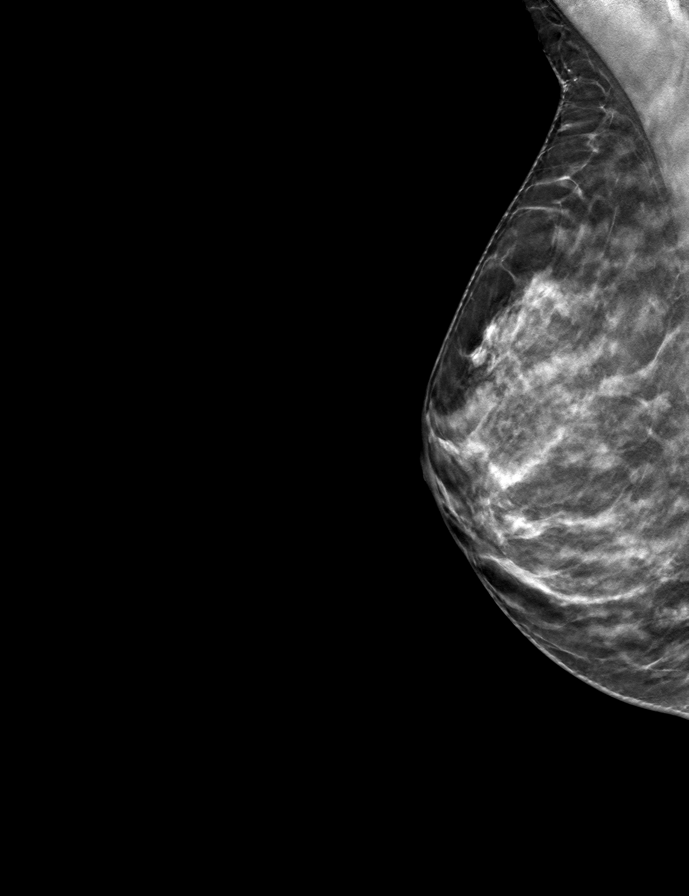

[9 of 24 positions shown; findings below may reference images not displayed]

ACR Breast Density Category c: The breast tissue is heterogeneously
dense, which may obscure small masses.
FINDINGS: In the left breast, a possible mass warrants further evaluation.
This possible mass is seen within the upper inner quadrant of the
LEFT breast, MLO slice 18 and cc slice 21.

In the right breast, no findings suspicious for malignancy.
IMPRESSION: Further evaluation is suggested for possible mass in the left
breast.

RECOMMENDATION:
Ultrasound of the left breast. (Code:[id number]-[id number])

The patient will be contacted regarding the findings, and additional
imaging will be scheduled.

BI-RADS CATEGORY  0: Incomplete. Need additional imaging evaluation
and/or prior mammograms for comparison.

## 2023-06-10 ENCOUNTER — Other Ambulatory Visit: Payer: Self-pay | Admitting: Nurse Practitioner

## 2023-06-10 DIAGNOSIS — N63 Unspecified lump in unspecified breast: Secondary | ICD-10-CM

## 2023-07-06 ENCOUNTER — Ambulatory Visit
Admission: RE | Admit: 2023-07-06 | Discharge: 2023-07-06 | Disposition: A | Discharge status: Home / Self Care | Payer: Managed Care, Other (non HMO) | Source: Ambulatory Visit | Attending: Nurse Practitioner | Admitting: Nurse Practitioner

## 2023-07-06 ENCOUNTER — Ambulatory Visit
Admission: RE | Admit: 2023-07-06 | Discharge: 2023-07-06 | Disposition: A | Discharge status: Home / Self Care | Payer: Managed Care, Other (non HMO) | Source: Ambulatory Visit | Attending: Nurse Practitioner

## 2023-07-06 DIAGNOSIS — N63 Unspecified lump in unspecified breast: Secondary | ICD-10-CM

## 2023-12-15 IMAGING — US US BREAST*L* LIMITED INC AXILLA
1 series · 5 of 5 positions shown · non-contrast
Comparison: Previous exams including LEFT breast ultrasound dated
05/18/2021.

CLINICAL DATA: Follow-up for probably benign complicated cyst in
the LEFT breast.

EXAM:
ULTRASOUND OF THE LEFT BREAST

[Series 1: us breast*left* limited inc axilla · 0.06mm/px · 5 of 5 slices shown]
[im 1/5]
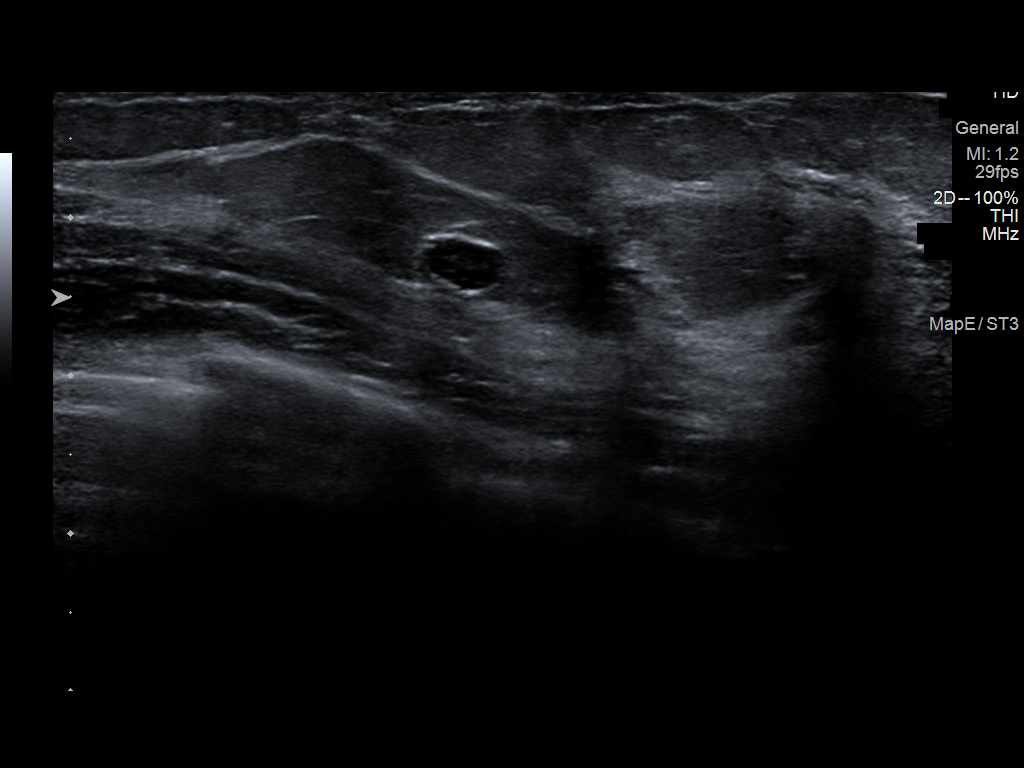
[im 2/5]
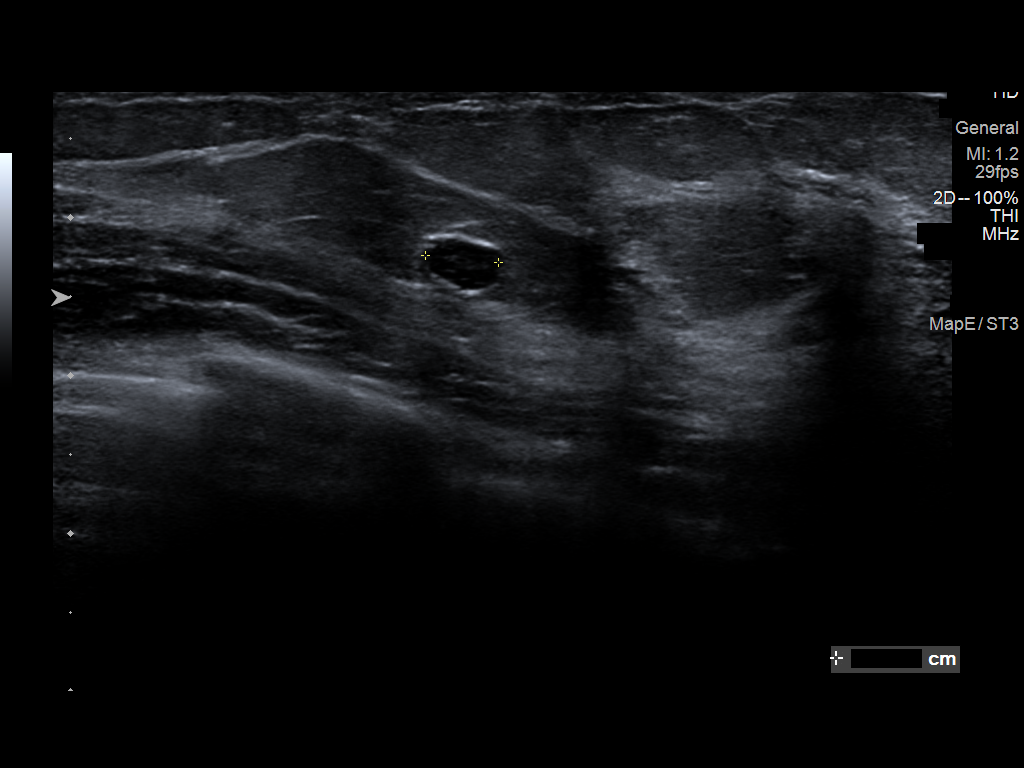
[im 3/5]
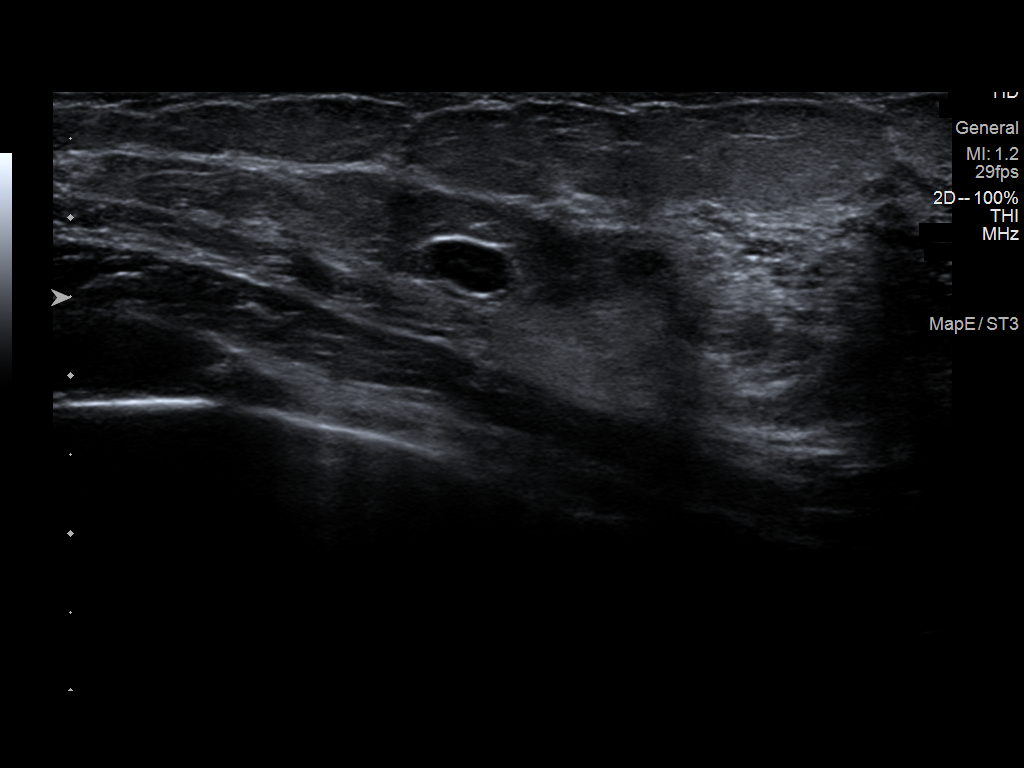
[im 4/5]
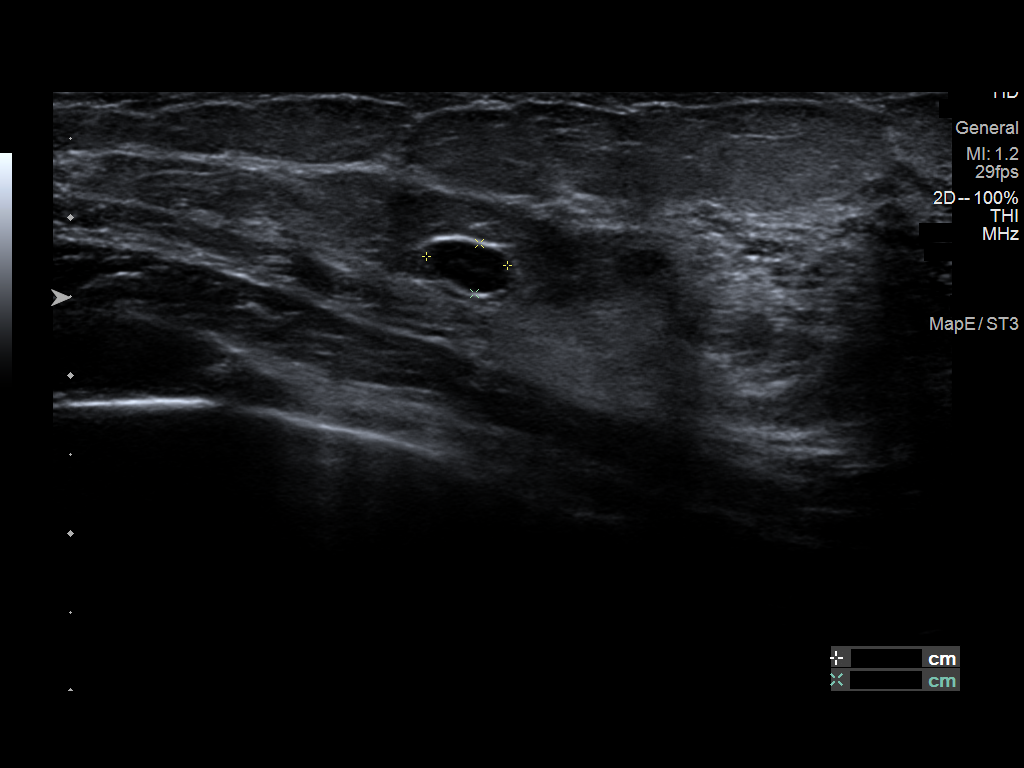
[im 5/5]
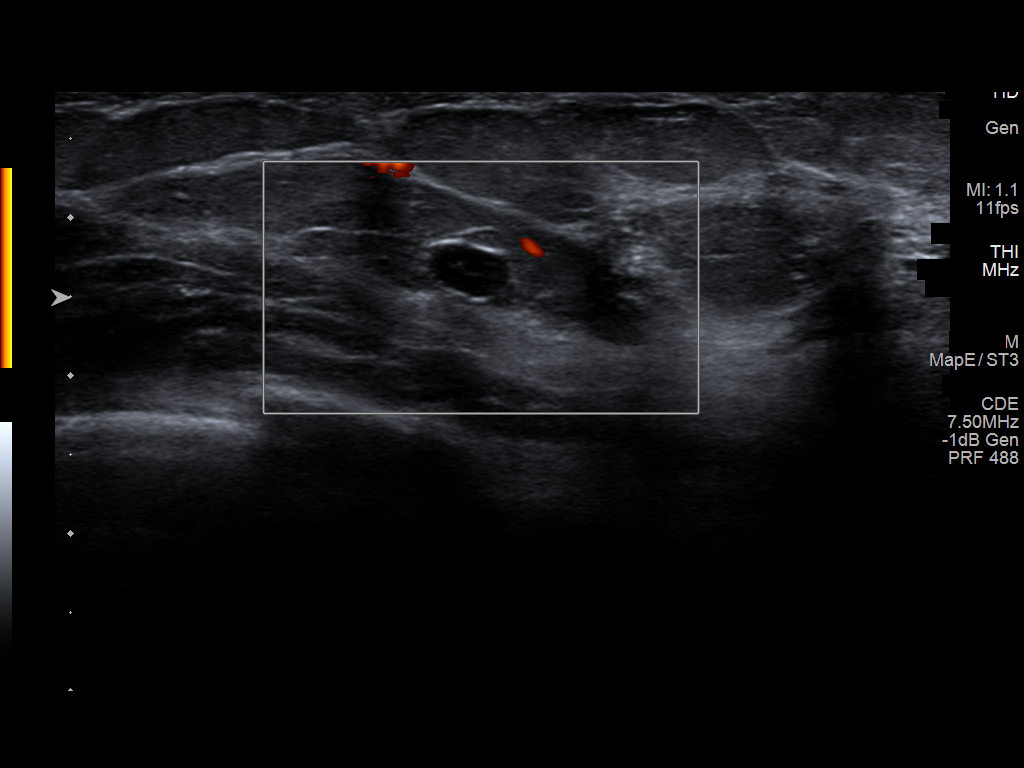

[5 of 5 positions shown; findings below may reference images not displayed]

FINDINGS: Targeted ultrasound is performed, again showing an oval
circumscribed nearly anechoic mass in the LEFT breast at the 10
o'clock axis, 3 cm from the nipple, measuring 5 x 3 x 5 mm, not
significantly changed compared to the previous ultrasound, again
most suggestive of a minimally complicated cyst.
IMPRESSION: Probably benign minimally complicated cyst within the LEFT breast at
the 10 o'clock axis, 3 cm from the nipple, measuring 5 x 3 x 5 mm,
not significantly changed compared to the previous ultrasound.
Recommend additional follow-up diagnostic exam in 6 months to ensure
continued stability.

RECOMMENDATION:
Bilateral diagnostic mammogram, and possible LEFT breast ultrasound,
in 6 months.

I have discussed the findings and recommendations with the patient.
If applicable, a reminder letter will be sent to the patient
regarding the next appointment.

BI-RADS CATEGORY  3: Probably benign.

## 2024-05-08 NOTE — Progress Notes (Unsigned)
 No chief complaint on file.  PCP is SaraBeth Early. Last seen in this office by PCP in 12/2022, also for GI complaints,  She had complained of central abdominal pain, pinching sensation, increased gas, and discomfort under the rib cage. No heartburn, nausea, or vomiting reported. Patient reports localized pain and pulling sensation around the umbilical area. Physical examination revealed tightness in the area but no concerning findings related to the umbilical artery.Given the symptoms and location, consider possible peptic ulcer presence. She does endorse a diet high in spicy foods.   She was treated with pantoprazole  40 mg (told to take BID x 1-2 weeks, then once daily, to complete 4 weeks of treatment). She was advised to avoid spicy foods, limit caffeine, and to f/u if not improving.    PMH, PSH, SH reviewed   ROS:    PHYSICAL EXAM:  There were no vitals taken for this visit.  Wt Readings from Last 3 Encounters:  12/09/22 112 lb (50.8 kg)  03/26/22 111 lb (50.3 kg)  01/06/21 111 lb 3.2 oz (50.4 kg)       ASSESSMENT/PLAN:  Has she ever had colon cancer screening??  No CPe's in chart  Needs to schedule CPE with PCP, SB

## 2024-05-09 ENCOUNTER — Encounter: Payer: Self-pay | Admitting: Family Medicine

## 2024-05-09 ENCOUNTER — Other Ambulatory Visit: Payer: Self-pay

## 2024-05-09 ENCOUNTER — Telehealth: Payer: Self-pay

## 2024-05-09 ENCOUNTER — Ambulatory Visit: Admitting: Family Medicine

## 2024-05-09 VITALS — BP 120/60 | HR 72 | Ht 63.0 in | Wt 109.4 lb

## 2024-05-09 DIAGNOSIS — Z681 Body mass index (BMI) 19 or less, adult: Secondary | ICD-10-CM | POA: Diagnosis not present

## 2024-05-09 DIAGNOSIS — Z1211 Encounter for screening for malignant neoplasm of colon: Secondary | ICD-10-CM | POA: Diagnosis not present

## 2024-05-09 DIAGNOSIS — K219 Gastro-esophageal reflux disease without esophagitis: Secondary | ICD-10-CM | POA: Diagnosis not present

## 2024-05-09 DIAGNOSIS — Z7185 Encounter for immunization safety counseling: Secondary | ICD-10-CM

## 2024-05-09 DIAGNOSIS — Z0000 Encounter for general adult medical examination without abnormal findings: Secondary | ICD-10-CM

## 2024-05-09 DIAGNOSIS — E559 Vitamin D deficiency, unspecified: Secondary | ICD-10-CM

## 2024-05-09 NOTE — Telephone Encounter (Signed)
 Pt has evening appt scheduled, wants to come on a different day for labs. Can you put labs in?

## 2024-05-09 NOTE — Patient Instructions (Addendum)
 Continue to limit acidic foods, caffeine from your diet, eating small meals more frequently rather than large meals and wait at least 2-3 hours before reclining. Try elevating the head of the bead with the brick/block, or a wedge pillow (raising the chest above the stomach, not just the head).  You can do a 2 week course of Prilosec OTC now, if symptoms are worsening. We discussed using prilosec OTC or Pepcid AC prior to a spicy Mexican meal, in order to prevent symptoms (vs Pepcid complete, or use of an antacid such as mylanta, maalox or tums as needed).  Red flags which would require seeing a gastroenterologist (and possible endoscopy) would be trouble swallowing, unexplained weight loss, or black or bloody stools.  You should be continuing to take the over-the-counter vitamin D3 daily, long-term. Without knowing your level, I can't give more specific recommendations. Keeping your vitamin d levels normal will help your bones--as long as you also get adequate calcium intake, and regular weight-bearing exercise. Try and build up some muscle strength.

## 2024-08-24 ENCOUNTER — Encounter: Payer: Self-pay | Admitting: Internal Medicine

## 2024-08-24 ENCOUNTER — Other Ambulatory Visit: Payer: Self-pay

## 2024-08-24 DIAGNOSIS — Z0000 Encounter for general adult medical examination without abnormal findings: Secondary | ICD-10-CM

## 2024-08-24 LAB — LIPID PANEL

## 2024-08-25 LAB — CMP14+EGFR
ALT: 7 IU/L (ref 0–32)
AST: 16 IU/L (ref 0–40)
Albumin: 4.3 g/dL (ref 3.8–4.9)
Alkaline Phosphatase: 44 IU/L — ABNORMAL LOW (ref 49–135)
BUN/Creatinine Ratio: 22 (ref 9–23)
BUN: 16 mg/dL (ref 6–24)
Bilirubin Total: 0.4 mg/dL (ref 0.0–1.2)
CO2: 22 mmol/L (ref 20–29)
Calcium: 9.8 mg/dL (ref 8.7–10.2)
Chloride: 99 mmol/L (ref 96–106)
Creatinine, Ser: 0.73 mg/dL (ref 0.57–1.00)
Globulin, Total: 2.7 g/dL (ref 1.5–4.5)
Glucose: 87 mg/dL (ref 70–99)
Potassium: 4.3 mmol/L (ref 3.5–5.2)
Sodium: 138 mmol/L (ref 134–144)
Total Protein: 7 g/dL (ref 6.0–8.5)
eGFR: 98 mL/min/1.73 (ref >59)

## 2024-08-25 LAB — CBC WITH DIFFERENTIAL/PLATELET
Basophils Absolute: 0 x10E3/uL (ref 0.0–0.2)
Basos: 1 %
EOS (ABSOLUTE): 0.1 x10E3/uL (ref 0.0–0.4)
Eos: 2 %
Hematocrit: 42.2 % (ref 34.0–46.6)
Hemoglobin: 13.8 g/dL (ref 11.1–15.9)
Immature Grans (Abs): 0 x10E3/uL (ref 0.0–0.1)
Immature Granulocytes: 0 %
Lymphocytes Absolute: 1.6 x10E3/uL (ref 0.7–3.1)
Lymphs: 31 %
MCH: 30.7 pg (ref 26.6–33.0)
MCHC: 32.7 g/dL (ref 31.5–35.7)
MCV: 94 fL (ref 79–97)
Monocytes Absolute: 0.3 x10E3/uL (ref 0.1–0.9)
Monocytes: 7 %
Neutrophils Absolute: 3.1 x10E3/uL (ref 1.4–7.0)
Neutrophils: 59 %
Platelets: 284 x10E3/uL (ref 150–450)
RBC: 4.5 x10E6/uL (ref 3.77–5.28)
RDW: 12.6 % (ref 11.7–15.4)
WBC: 5.2 x10E3/uL (ref 3.4–10.8)

## 2024-08-25 LAB — LIPID PANEL
Chol/HDL Ratio: 2.4 ratio (ref 0.0–4.4)
Cholesterol, Total: 215 mg/dL — ABNORMAL HIGH (ref 100–199)
HDL: 90 mg/dL (ref >39)
LDL Chol Calc (NIH): 116 mg/dL — ABNORMAL HIGH (ref 0–99)
Triglycerides: 48 mg/dL (ref 0–149)
VLDL Cholesterol Cal: 9 mg/dL (ref 5–40)

## 2024-08-25 LAB — HEMOGLOBIN A1C
Est. average glucose Bld gHb Est-mCnc: 108 mg/dL
Hgb A1c MFr Bld: 5.4 % (ref 4.8–5.6)

## 2024-08-25 LAB — TSH: TSH: 2.06 u[IU]/mL (ref 0.450–4.500)

## 2024-08-27 NOTE — Progress Notes (Signed)
 Flu: 08/15/2024 Covid: no Pneumonia: no T-dap: yes about 4 years ago Shingrix: no   Catheline Doing, DNP, AGNP-c [facility] [address] [place], [place] [address] Main Office [phone] VISIT TYPE: CPE on 08/28/2024 Today's Vitals   08/28/24 1537  BP: 108/70  Pulse: 68  Weight: 109 lb (49.4 kg)  Height: 5' 2.5 (1.588 m)   Body mass index is 19.62 kg/m. BP 108/70   Pulse 68   Ht 5' 2.5 (1.588 m)   Wt 109 lb (49.4 kg)   BMI 19.62 kg/m   Subjective:    Patient ID: Chelsea Sellers, female    DOB: September 05, 1971, 53 y.o.   MRN: [id number]  HPI: Periods : July 10, August 26, October 6  History of Present Illness SHANORA CHRISTENSEN is a 53 year old female who presents for annual exam.   Her acid reflux symptoms have worsened since her last visit in July. Initially, she attempted to manage the symptoms without medication but eventually started a two-week course of Prilosec, completed in mid-October. Since then, she has been taking Pepcid twice daily. Despite these efforts, she continues to experience significant symptoms, including a persistent burning sensation that sometimes reaches her mouth, described as a 'bitter' taste similar to chewing aspirin. The symptoms occur even when she is upright.  Her dietary habits have been challenging to maintain, especially with recent holidays like Halloween, and she admits to not being perfect in avoiding trigger foods. She recalls that when she first discussed her symptoms in 2024, the discomfort was more localized, but now it feels more widespread.  She was surprised to learn her LDL cholesterol was elevated on recent labs, given her diet is low in saturated fats. She consumes oatmeal, berries, flaxseed, and salads regularly, with occasional indulgences like bacon or sausage. She has a family history of high cholesterol, as her mother has had it for a long time.  She is experiencing menopausal symptoms, including hot  flashes and disrupted sleep, which she manages by dressing in layers. She has irregular menstrual cycles, with the last one occurring in October. She reports brain fog and occasional forgetfulness.  She has a history of normal Pap smears. She was advised to repeat the Pap smear this year, as she is not postmenopausal and continues to have menstrual cycles. She is cautious about undergoing invasive procedures unless necessary.  Pertinent items are noted in HPI.  Most Recent Depression Screen:     08/28/2024    3:37 PM 01/06/2021   11:18 AM  Depression screen PHQ 2/9  Decreased Interest 0 0  Down, Depressed, Hopeless 0 0  PHQ - 2 Score 0 0  Altered sleeping 0   Tired, decreased energy 0   Change in appetite 0   Feeling bad or failure about yourself  0   Trouble concentrating 0   Moving slowly or fidgety/restless 0   Suicidal thoughts 0   PHQ-9 Score 0   Difficult doing work/chores Not difficult at all    Most Recent Anxiety Screen:      No data to display         Most Recent Fall Screen:    08/28/2024    3:36 PM 01/06/2021   11:18 AM  Fall Risk   Falls in the past year? 0 1  Number falls in past yr: 0 0  Injury with Fall? 0 1  Risk for fall due to : No Fall Risks   Follow up Falls evaluation completed  Past medical history, surgical history, medications, allergies, family history and social history reviewed with patient today and changes made to appropriate areas of the chart.  Past Medical History:  Past Medical History:  Diagnosis Date   Breast fibroadenoma in female    Medications:  Current Outpatient Medications on File Prior to Visit  Medication Sig   famotidine-calcium carbonate-magnesium hydroxide (PEPCID COMPLETE) [phone] MG chewable tablet Chew 1 tablet by mouth daily as needed.   NONFORMULARY OR COMPOUNDED ITEM  (Patient not taking: Reported on 08/28/2024)   No current facility-administered medications on file prior to visit.   Surgical History:   Past Surgical History:  Procedure Laterality Date   CLOSED REDUCTION NASAL FRACTURE     Allergies:  No Known Allergies Family History:  Family History  Problem Relation Age of Onset   Diabetes Mother    Hyperlipidemia Mother    Prostate cancer Father    Prostate cancer Maternal Uncle    Colon cancer Maternal Grandmother        Objective:    BP 108/70   Pulse 68   Ht 5' 2.5 (1.588 m)   Wt 109 lb (49.4 kg)   BMI 19.62 kg/m   Wt Readings from Last 3 Encounters:  08/28/24 109 lb (49.4 kg)  05/09/24 109 lb 6.4 oz (49.6 kg)  12/09/22 112 lb (50.8 kg)    Physical Exam Vitals and nursing note reviewed.  Constitutional:      General: She is not in acute distress.    Appearance: Normal appearance.  HENT:     Head: Normocephalic and atraumatic.     Right Ear: Hearing, tympanic membrane, ear canal and external ear normal.     Left Ear: Hearing, tympanic membrane, ear canal and external ear normal.     Nose: Nose normal.     Right Sinus: No maxillary sinus tenderness or frontal sinus tenderness.     Left Sinus: No maxillary sinus tenderness or frontal sinus tenderness.     Mouth/Throat:     Lips: Pink.     Mouth: Mucous membranes are moist.     Pharynx: Oropharynx is clear.  Eyes:     General: Lids are normal. Vision grossly intact.     Extraocular Movements: Extraocular movements intact.     Conjunctiva/sclera: Conjunctivae normal.     Pupils: Pupils are equal, round, and reactive to light.     Funduscopic exam:    Right eye: Red reflex present.        Left eye: Red reflex present.    Visual Fields: Right eye visual fields normal and left eye visual fields normal.  Neck:     Thyroid: No thyromegaly.     Vascular: No carotid bruit.  Cardiovascular:     Rate and Rhythm: Normal rate and regular rhythm.     Chest Wall: PMI is not displaced.     Pulses: Normal pulses.          Dorsalis pedis pulses are 2+ on the right side and 2+ on the left side.       Posterior  tibial pulses are 2+ on the right side and 2+ on the left side.     Heart sounds: Normal heart sounds. No murmur heard. Pulmonary:     Effort: Pulmonary effort is normal. No respiratory distress.     Breath sounds: Normal breath sounds.  Abdominal:     General: Abdomen is flat. Bowel sounds are normal. There is no distension.  Palpations: Abdomen is soft. There is no hepatomegaly, splenomegaly or mass.     Tenderness: There is abdominal tenderness. There is no right CVA tenderness, left CVA tenderness, guarding or rebound.     Comments: Mild epigastric tenderness.  Musculoskeletal:        General: Normal range of motion.     Cervical back: Full passive range of motion without pain, normal range of motion and neck supple. No tenderness.     Right lower leg: No edema.     Left lower leg: No edema.  Feet:     Left foot:     Toenail Condition: Left toenails are normal.  Lymphadenopathy:     Cervical: No cervical adenopathy.     Upper Body:     Right upper body: No supraclavicular adenopathy.     Left upper body: No supraclavicular adenopathy.  Skin:    General: Skin is warm and dry.     Capillary Refill: Capillary refill takes less than 2 seconds.     Nails: There is no clubbing.  Neurological:     General: No focal deficit present.     Mental Status: She is alert and oriented to person, place, and time.     GCS: GCS eye subscore is 4. GCS verbal subscore is 5. GCS motor subscore is 6.     Sensory: Sensation is intact.     Motor: Motor function is intact.     Coordination: Coordination is intact.     Gait: Gait is intact.     Deep Tendon Reflexes: Reflexes are normal and symmetric.  Psychiatric:        Attention and Perception: Attention normal.        Mood and Affect: Mood normal.        Speech: Speech normal.        Behavior: Behavior normal. Behavior is cooperative.        Thought Content: Thought content normal.        Cognition and Memory: Cognition and memory normal.         Judgment: Judgment normal.      Results for orders placed or performed in visit on 08/28/24  Cytology - PAP([place])   Collection Time: 08/28/24  4:55 PM  Result Value Ref Range   High risk HPV Negative    Adequacy      Satisfactory for evaluation; transformation zone component ABSENT.   Diagnosis      - Negative for intraepithelial lesion or malignancy (NILM)   Comment Normal Reference Range HPV - Negative        Assessment & Plan:   Problem List Items Addressed This Visit     GERD (gastroesophageal reflux disease)   Chronic gastroesophageal reflux disease with worsening symptoms despite dietary modifications and medication. Symptoms include burning sensation in the esophagus, bitter taste in the mouth, and persistent symptoms even when sitting upright. Differential diagnosis includes hiatal hernia, incompetent esophageal sphincter, and H. pylori infection. H. pylori infection is considered as a potential cause of persistent symptoms. - Ordered H. pylori breath test at an outside lab. - Continue Pepcid as needed for symptom management. - Will consider referral to GI for further evaluation if symptoms persist.      Relevant Medications   famotidine-calcium carbonate-magnesium hydroxide (PEPCID COMPLETE) 10-800-165 MG chewable tablet   Other Relevant Orders   Ambulatory referral to Gastroenterology   Encounter for annual physical exam - Primary   CPE completed today. Review of HM activities and  recommendations discussed and provided on AVS. Anticipatory guidance, diet, and exercise recommendations provided. Medications, allergies, and hx reviewed and updated as necessary. Orders placed as listed below.  Plan: - Labs ordered. Will make changes as necessary based on results.  - I will review these results and send recommendations via MyChart or a telephone call.  - F/U with CPE in 1 year or sooner for acute/chronic health needs as directed.        Perimenopausal  vasomotor symptoms   Experiencing vasomotor symptoms including hot flashes, sleep disturbances, and brain fog. Symptoms are impacting quality of life. Discussed potential treatment options including hormone replacement therapy and non-hormonal options like black cohosh. She prefers to manage symptoms without medication at this time. - Continue to monitor symptoms and consider treatment options if symptoms worsen.      Cervical cancer screening   Prior Pap smear showed endometrial cells, raising concern for potential endometrial cancer risk. She is not postmenopausal and prefers non-invasive monitoring. Discussed the importance of surveillance due to age and risk factors. - Performed repeat Pap smear for surveillance. - Will consider ultrasound if endometrial cells are found again.      Relevant Orders   Cytology - PAP([place]) (Completed)   Elevated lipids   Slightly elevated LDL cholesterol despite a low saturated fat diet. HDL cholesterol is high, providing some cardiovascular protection. Discussed potential genetic predisposition and reassured that current levels do not warrant immediate concern.       Other Visit Diagnoses       Colon cancer screening       Relevant Orders   Ambulatory referral to Gastroenterology        Follow up plan: Return in about 1 year (around 08/28/2025) for CPE.  NEXT PREVENTATIVE PHYSICAL DUE IN 1 YEAR.  PATIENT COUNSELING PROVIDED FOR ALL ADULT PATIENTS: A well balanced diet low in saturated fats, cholesterol, and moderation in carbohydrates.  This can be as simple as monitoring portion sizes and cutting back on sugary beverages such as soda and juice to start with.    Daily water consumption of at least 64 ounces.  Physical activity at least 180 minutes per week.  If just starting out, start 10 minutes a day and work your way up.   This can be as simple as taking the stairs instead of the elevator and walking 2-3 laps around the office   purposefully every day.   STD protection, partner selection, and regular testing if high risk.  Limited consumption of alcoholic beverages if alcohol is consumed. For men, I recommend no more than 14 alcoholic beverages per week, spread out throughout the week (max 2 per day). Avoid binge drinking or consuming large quantities of alcohol in one setting.  Please let me know if you feel you may need help with reduction or quitting alcohol consumption.   Avoidance of nicotine, if used. Please let me know if you feel you may need help with reduction or quitting nicotine use.   Daily mental health attention. This can be in the form of 5 minute daily meditation, prayer, journaling, yoga, reflection, etc.  Purposeful attention to your emotions and mental state can significantly improve your overall wellbeing  and  Health.  Please know that I am here to help you with all of your health care goals and am happy to work with you to find a solution that works best for you.  The greatest advice I have received with any changes in life are to  take it one step at a time, that even means if all you can focus on is the next 60 seconds, then do that and celebrate your victories.  With any changes in life, you will have set backs, and that is OK. The important thing to remember is, if you have a set back, it is not a failure, it is an opportunity to try again! Screening Testing Mammogram Every 1 -2 years based on history and risk factors Starting at age 61 Pap Smear Ages 21-39 every 3 years Ages 44-65 every 5 years with HPV testing More frequent testing may be required based on results and history Colon Cancer Screening Every 1-10 years based on test performed, risk factors, and history Starting at age 334 Bone Density Screening Every 2-10 years based on history Starting at age 59 for women Recommendations for men differ based on medication usage, history, and risk factors AAA Screening One time  ultrasound Men 80-101 years old who have every smoked Lung Cancer Screening Low Dose Lung CT every 12 months Age 34-80 years with a 30 pack-year smoking history who still smoke or who have quit within the last 15 years   Screening Labs Routine  Labs: Complete Blood Count (CBC), Complete Metabolic Panel (CMP), Cholesterol (Lipid Panel) Every 6-12 months based on history and medications May be recommended more frequently based on current conditions or previous results Hemoglobin A1c Lab Every 3-12 months based on history and previous results Starting at age 50 or earlier with diagnosis of diabetes, high cholesterol, BMI >26, and/or risk factors Frequent monitoring for patients with diabetes to ensure blood sugar control Thyroid Panel (TSH) Every 6 months based on history, symptoms, and risk factors May be repeated more often if on medication HIV One time testing for all patients 46 and older May be repeated more frequently for patients with increased risk factors or exposure Hepatitis C One time testing for all patients 17 and older May be repeated more frequently for patients with increased risk factors or exposure Gonorrhea, Chlamydia Every 12 months for all sexually active persons 13-24 years Additional monitoring may be recommended for those who are considered high risk or who have symptoms Every 12 months for any woman on birth control, regardless of sexual activity PSA Men 42-62 years old with risk factors Additional screening may be recommended from age 8-69 based on risk factors, symptoms, and history  Vaccine Recommendations Tetanus Booster All adults every 10 years Flu Vaccine All patients 6 months and older every year COVID Vaccine All patients 12 years and older Initial dosing with booster May recommend additional booster based on age and health history HPV Vaccine 2 doses all patients age 33-26 Dosing may be considered for patients over 26 Shingles Vaccine  (Shingrix) 2 doses all adults 55 years and older Pneumonia (Pneumovax 58) All adults 65 years and older May recommend earlier dosing based on health history One year apart from Prevnar 72 Pneumonia (Prevnar 25) All adults 65 years and older Dosed 1 year after Pneumovax 23 Pneumonia (Prevnar 20) One time alternative to the two dosing of 13 and 23 For all adults with initial dose of 23, 20 is recommended 1 year later For all adults with initial dose of 13, 23 is still recommended as second option 1 year later

## 2024-08-28 ENCOUNTER — Ambulatory Visit: Payer: Self-pay | Admitting: Nurse Practitioner

## 2024-08-28 ENCOUNTER — Other Ambulatory Visit (HOSPITAL_COMMUNITY)
Admission: RE | Admit: 2024-08-28 | Discharge: 2024-08-28 | Disposition: A | Source: Ambulatory Visit | Attending: Nurse Practitioner | Admitting: Nurse Practitioner

## 2024-08-28 ENCOUNTER — Encounter: Payer: Self-pay | Admitting: Nurse Practitioner

## 2024-08-28 VITALS — BP 108/70 | HR 68 | Ht 62.5 in | Wt 109.0 lb

## 2024-08-28 DIAGNOSIS — K219 Gastro-esophageal reflux disease without esophagitis: Secondary | ICD-10-CM | POA: Diagnosis not present

## 2024-08-28 DIAGNOSIS — N951 Menopausal and female climacteric states: Secondary | ICD-10-CM | POA: Diagnosis not present

## 2024-08-28 DIAGNOSIS — Z0000 Encounter for general adult medical examination without abnormal findings: Secondary | ICD-10-CM

## 2024-08-28 DIAGNOSIS — Z1211 Encounter for screening for malignant neoplasm of colon: Secondary | ICD-10-CM | POA: Diagnosis not present

## 2024-08-28 DIAGNOSIS — Z124 Encounter for screening for malignant neoplasm of cervix: Secondary | ICD-10-CM | POA: Insufficient documentation

## 2024-08-28 DIAGNOSIS — E785 Hyperlipidemia, unspecified: Secondary | ICD-10-CM

## 2024-08-28 NOTE — Patient Instructions (Addendum)
 I will get the H. Pylori test set up and reach out to you with the location so you can have this completed.   I have sent the referral to [facility]. If you are not hearing from them please let me know.   You are of the age range for shingles vaccine and pneumonia vaccine. You can have these done here or at a pharmacy of your choice.      For all adult patients, I recommend A well balanced diet low in saturated fats, cholesterol, and moderation in carbohydrates.   This can be as simple as monitoring portion sizes and cutting back on sugary beverages such as soda and juice to start with.    Daily water consumption of at least 64 ounces.  Physical activity at least 180 minutes per week, if just starting out.   This can be as simple as taking the stairs instead of the elevator and walking 2-3 laps around the office  purposefully every day.   STD protection, partner selection, and regular testing if high risk.  Limited consumption of alcoholic beverages if alcohol is consumed.  For women, I recommend no more than 7 alcoholic beverages per week, spread out throughout the week.  Avoid binge drinking or consuming large quantities of alcohol in one setting.   Please let me know if you feel you may need help with reduction or quitting alcohol consumption.   Avoidance of nicotine, if used.  Please let me know if you feel you may need help with reduction or quitting nicotine use.   Daily mental health attention.  This can be in the form of 5 minute daily meditation, prayer, journaling, yoga, reflection, etc.   Purposeful attention to your emotions and mental state can significantly improve your overall wellbeing  and  Health.  Please know that I am here to help you with all of your health care goals and am happy to work with you to find a solution that works best for you.  The greatest advice I have received with any changes in life are to take it one step at a time, that even means if all you  can focus on is the next 60 seconds, then do that and celebrate your victories.  With any changes in life, you will have set backs, and that is OK. The important thing to remember is, if you have a set back, it is not a failure, it is an opportunity to try again!  Health Maintenance Recommendations Screening Testing Mammogram Every 1 -2 years based on history and risk factors Starting at age 48 Pap Smear Ages 21-39 every 3 years Ages 52-65 every 5 years with HPV testing More frequent testing may be required based on results and history Colon Cancer Screening Every 1-10 years based on test performed, risk factors, and history Starting at age 25 Bone Density Screening Every 2-10 years based on history Starting at age 73 for women Recommendations for men differ based on medication usage, history, and risk factors AAA Screening One time ultrasound Men 33-75 years old who have every smoked Lung Cancer Screening Low Dose Lung CT every 12 months Age 45-80 years with a 30 pack-year smoking history who still smoke or who have quit within the last 15 years  Screening Labs Routine  Labs: Complete Blood Count (CBC), Complete Metabolic Panel (CMP), Cholesterol (Lipid Panel) Every 6-12 months based on history and medications May be recommended more frequently based on current conditions or previous results Hemoglobin A1c  Lab Every 3-12 months based on history and previous results Starting at age 25 or earlier with diagnosis of diabetes, high cholesterol, BMI >26, and/or risk factors Frequent monitoring for patients with diabetes to ensure blood sugar control Thyroid Panel (TSH w/ T3 & T4) Every 6 months based on history, symptoms, and risk factors May be repeated more often if on medication HIV One time testing for all patients 61 and older May be repeated more frequently for patients with increased risk factors or exposure Hepatitis C One time testing for all patients 42 and older May be  repeated more frequently for patients with increased risk factors or exposure Gonorrhea, Chlamydia Every 12 months for all sexually active persons 13-24 years Additional monitoring may be recommended for those who are considered high risk or who have symptoms PSA Men 63-86 years old with risk factors Additional screening may be recommended from age 57-69 based on risk factors, symptoms, and history  Vaccine Recommendations Tetanus Booster All adults every 10 years Flu Vaccine All patients 6 months and older every year COVID Vaccine All patients 12 years and older Initial dosing with booster May recommend additional booster based on age and health history HPV Vaccine 2 doses all patients age 42-26 Dosing may be considered for patients over 26 Shingles Vaccine (Shingrix) 2 doses all adults 55 years and older Pneumonia (Pneumovax 23) All adults 65 years and older May recommend earlier dosing based on health history Pneumonia (Prevnar 38) All adults 65 years and older Dosed 1 year after Pneumovax 23  Additional Screening, Testing, and Vaccinations may be recommended on an individualized basis based on family history, health history, risk factors, and/or exposure.

## 2024-08-29 ENCOUNTER — Ambulatory Visit: Payer: Self-pay | Admitting: Nurse Practitioner

## 2024-08-31 LAB — CYTOLOGY - PAP
Adequacy: ABSENT
Comment: NEGATIVE
Diagnosis: NEGATIVE
High risk HPV: NEGATIVE

## 2024-09-04 ENCOUNTER — Ambulatory Visit: Payer: Self-pay | Admitting: Nurse Practitioner

## 2024-09-10 ENCOUNTER — Telehealth: Payer: Self-pay

## 2024-09-10 ENCOUNTER — Other Ambulatory Visit: Payer: Self-pay | Admitting: Nurse Practitioner

## 2024-09-10 DIAGNOSIS — N951 Menopausal and female climacteric states: Secondary | ICD-10-CM | POA: Insufficient documentation

## 2024-09-10 DIAGNOSIS — Z124 Encounter for screening for malignant neoplasm of cervix: Secondary | ICD-10-CM | POA: Insufficient documentation

## 2024-09-10 DIAGNOSIS — K219 Gastro-esophageal reflux disease without esophagitis: Secondary | ICD-10-CM

## 2024-09-10 DIAGNOSIS — E785 Hyperlipidemia, unspecified: Secondary | ICD-10-CM | POA: Insufficient documentation

## 2024-09-10 DIAGNOSIS — Z0000 Encounter for general adult medical examination without abnormal findings: Secondary | ICD-10-CM | POA: Insufficient documentation

## 2024-09-10 DIAGNOSIS — R198 Other specified symptoms and signs involving the digestive system and abdomen: Secondary | ICD-10-CM

## 2024-09-10 NOTE — Assessment & Plan Note (Signed)
 Experiencing vasomotor symptoms including hot flashes, sleep disturbances, and brain fog. Symptoms are impacting quality of life. Discussed potential treatment options including hormone replacement therapy and non-hormonal options like black cohosh. She prefers to manage symptoms without medication at this time. - Continue to monitor symptoms and consider treatment options if symptoms worsen.

## 2024-09-10 NOTE — Assessment & Plan Note (Signed)

## 2024-09-10 NOTE — Assessment & Plan Note (Signed)
 Prior Pap smear showed endometrial cells, raising concern for potential endometrial cancer risk. She is not postmenopausal and prefers non-invasive monitoring. Discussed the importance of surveillance due to age and risk factors. - Performed repeat Pap smear for surveillance. - Will consider ultrasound if endometrial cells are found again.

## 2024-09-10 NOTE — Telephone Encounter (Signed)
 I see that we put in a referral to GI but no order for H. Pylori test did you want her to get it done at GI?   Copied from CRM #[id number]. Topic: Clinical - Request for Lab/Test Order >> Sep 10, 2024  9:00 AM Fonda T wrote: Reason for CRM: Pt calling state she was seen on 08/28/24, states there was discussion with checking H.Pylori levels via breath test, and pt wanted to know if orders were placed to have this done, or if she just walks in to lab.  Per provider's chart notes states,  Ordered H. pylori breath test at an outside lab.  Per chart review no orders seen for H.Pylori.  Pt would like to have a follow up call to advise further.   Can be reached at [phone].  Pt is aware of same day call back.

## 2024-09-10 NOTE — Assessment & Plan Note (Signed)
 Slightly elevated LDL cholesterol despite a low saturated fat diet. HDL cholesterol is high, providing some cardiovascular protection. Discussed potential genetic predisposition and reassured that current levels do not warrant immediate concern.

## 2024-09-10 NOTE — Assessment & Plan Note (Signed)
 Chronic gastroesophageal reflux disease with worsening symptoms despite dietary modifications and medication. Symptoms include burning sensation in the esophagus, bitter taste in the mouth, and persistent symptoms even when sitting upright. Differential diagnosis includes hiatal hernia, incompetent esophageal sphincter, and H. pylori infection. H. pylori infection is considered as a potential cause of persistent symptoms. - Ordered H. pylori breath test at an outside lab. - Continue Pepcid as needed for symptom management. - Will consider referral to GI for further evaluation if symptoms persist.

## 2024-09-15 LAB — H. PYLORI BREATH TEST: H pylori Breath Test: NEGATIVE

## 2024-09-26 ENCOUNTER — Ambulatory Visit: Payer: Self-pay | Admitting: Nurse Practitioner

## 2024-09-27 LAB — HM COLONOSCOPY

## 2025-08-29 ENCOUNTER — Encounter: Admitting: Nurse Practitioner
